# Patient Record
Sex: Female | Born: 1975 | Hispanic: No | Marital: Married | Smoking: Never smoker
Health system: Southern US, Community
[De-identification: ages and names within clinical notes are randomized; demographics above are authoritative.]

## PROBLEM LIST (undated history)

## (undated) ENCOUNTER — Inpatient Hospital Stay (HOSPITAL_COMMUNITY): Payer: Self-pay

## (undated) DIAGNOSIS — O24419 Gestational diabetes mellitus in pregnancy, unspecified control: Secondary | ICD-10-CM

## (undated) HISTORY — DX: Gestational diabetes mellitus in pregnancy, unspecified control: O24.419

## (undated) HISTORY — PX: NO PAST SURGERIES: SHX2092

---

## 2001-06-28 ENCOUNTER — Ambulatory Visit (HOSPITAL_COMMUNITY): Admission: RE | Admit: 2001-06-28 | Discharge: 2001-06-28 | Payer: Self-pay | Admitting: Gynecology

## 2001-06-28 ENCOUNTER — Encounter: Payer: Self-pay | Admitting: Gynecology

## 2002-04-12 ENCOUNTER — Encounter: Admission: RE | Admit: 2002-04-12 | Discharge: 2002-07-11 | Payer: Self-pay | Admitting: Gynecology

## 2002-08-10 ENCOUNTER — Inpatient Hospital Stay (HOSPITAL_COMMUNITY): Admission: AD | Admit: 2002-08-10 | Discharge: 2002-08-12 | Payer: Self-pay | Admitting: Gynecology

## 2013-08-15 LAB — OB RESULTS CONSOLE GC/CHLAMYDIA
Chlamydia: NEGATIVE
Gonorrhea: NEGATIVE

## 2013-08-15 LAB — OB RESULTS CONSOLE HIV ANTIBODY (ROUTINE TESTING): HIV: NONREACTIVE

## 2013-08-15 LAB — CULTURE, OB URINE: URINE CULTURE, OB: NEGATIVE

## 2013-08-15 LAB — OB RESULTS CONSOLE VARICELLA ZOSTER ANTIBODY, IGG: Varicella: IMMUNE

## 2013-08-15 LAB — SICKLE CELL SCREEN: Sickle Cell Screen: NORMAL

## 2013-08-15 LAB — OB RESULTS CONSOLE ANTIBODY SCREEN: Antibody Screen: NEGATIVE

## 2013-08-15 LAB — OB RESULTS CONSOLE HGB/HCT, BLOOD
HCT: 36 %
Hemoglobin: 11.8 g/dL

## 2013-08-15 LAB — OB RESULTS CONSOLE HEPATITIS B SURFACE ANTIGEN: Hepatitis B Surface Ag: NEGATIVE

## 2013-08-15 LAB — OB RESULTS CONSOLE ABO/RH: RH Type: POSITIVE

## 2013-08-15 LAB — GLUCOSE TOLERANCE, 1 HOUR: GLUCOSE: 127

## 2013-08-15 LAB — OB RESULTS CONSOLE RUBELLA ANTIBODY, IGM: Rubella: IMMUNE

## 2013-09-14 LAB — OB RESULTS CONSOLE RPR: RPR: NONREACTIVE

## 2013-09-21 LAB — OB RESULTS CONSOLE GC/CHLAMYDIA
Chlamydia: NEGATIVE
Gonorrhea: NEGATIVE

## 2013-12-26 ENCOUNTER — Ambulatory Visit (INDEPENDENT_AMBULATORY_CARE_PROVIDER_SITE_OTHER): Payer: Self-pay | Admitting: Obstetrics and Gynecology

## 2013-12-26 ENCOUNTER — Encounter: Payer: Self-pay | Attending: Obstetrics and Gynecology | Admitting: *Deleted

## 2013-12-26 ENCOUNTER — Encounter: Payer: Self-pay | Admitting: Obstetrics and Gynecology

## 2013-12-26 VITALS — BP 114/68 | HR 88 | Ht 63.39 in | Wt 168.4 lb

## 2013-12-26 DIAGNOSIS — Z713 Dietary counseling and surveillance: Secondary | ICD-10-CM | POA: Insufficient documentation

## 2013-12-26 DIAGNOSIS — O0992 Supervision of high risk pregnancy, unspecified, second trimester: Secondary | ICD-10-CM

## 2013-12-26 DIAGNOSIS — O24419 Gestational diabetes mellitus in pregnancy, unspecified control: Secondary | ICD-10-CM | POA: Insufficient documentation

## 2013-12-26 DIAGNOSIS — Z23 Encounter for immunization: Secondary | ICD-10-CM

## 2013-12-26 HISTORY — DX: Gestational diabetes mellitus in pregnancy, unspecified control: O24.419

## 2013-12-26 LAB — POCT URINALYSIS DIP (DEVICE)
BILIRUBIN URINE: NEGATIVE
GLUCOSE, UA: NEGATIVE mg/dL
Hgb urine dipstick: NEGATIVE
KETONES UR: NEGATIVE mg/dL
Nitrite: NEGATIVE
Protein, ur: NEGATIVE mg/dL
SPECIFIC GRAVITY, URINE: 1.01 (ref 1.005–1.030)
Urobilinogen, UA: 0.2 mg/dL (ref 0.0–1.0)
pH: 6.5 (ref 5.0–8.0)

## 2013-12-26 LAB — CBC
HEMATOCRIT: 34.5 % — AB (ref 36.0–46.0)
HEMOGLOBIN: 11.5 g/dL — AB (ref 12.0–15.0)
MCH: 30.1 pg (ref 26.0–34.0)
MCHC: 33.3 g/dL (ref 30.0–36.0)
MCV: 90.3 fL (ref 78.0–100.0)
Platelets: 159 10*3/uL (ref 150–400)
RBC: 3.82 MIL/uL — AB (ref 3.87–5.11)
RDW: 13.5 % (ref 11.5–15.5)
WBC: 7.3 10*3/uL (ref 4.0–10.5)

## 2013-12-26 NOTE — Progress Notes (Signed)
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 Pt reports eating 2 meals & 2-3 snacks/d. Pt is taking a PNV. Pt reports no N/V or heartburn. NKFA. Pt reports no physical activity. Pt received verbal & written education in Spanish via interpreter about GDM diet. Discussed wt gain goals of 15-25# or 0.6#/wk. Pt agrees to follow GDM diet with 3 meals & 2-3 snacks/d with proper CHO/ protein combination. Pt has WIC & plans to BF. F/u in 4-6 wks Blondell RevealLaura Ana Woodroof, MS, RD, LDN, [facility]

## 2013-12-26 NOTE — Progress Notes (Signed)
Anatomy U/S with Radiology 12/30/13 @ 1030a, Spanish interpreter Trinna Postlex present.

## 2013-12-26 NOTE — Progress Notes (Signed)
  Patient was seen on 12/26/13 for Gestational Diabetes self-management . The following learning objectives were met by the patient :   States the definition of Gestational Diabetes  States when to check blood glucose levels  Demonstrates proper blood glucose monitoring techniques  States the effect of stress and exercise on blood glucose levels  States the importance of limiting caffeine and abstaining from alcohol and smoking  Plan:  Consider  increasing your activity level by walking daily as tolerated Begin checking BG before breakfast and 1-2 hours after first bit of breakfast, lunch and dinner after  as directed by MD  Take medication  as directed by MD  Blood glucose monitor given: [employer] Lot # [id number] Exp: 2015/11/28 Blood glucose reading: 108 1hpp banana  Patient instructed to monitor glucose levels: FBS: 60 - <90 2 hour: <120  Patient received the following handouts:  Nutrition Diabetes and Pregnancy  Carbohydrate Counting List  Meal Planning worksheet  Patient will be seen for follow-up as needed.

## 2013-12-26 NOTE — Progress Notes (Signed)
Initial visit-- transfer from health department.  Interpreter Norvel RichardsAlex Autry used for this encounter.  Tdap and flu vaccine today.

## 2013-12-26 NOTE — Progress Notes (Signed)
Transfer from the health department for new diagnosis of GDM. Today feeling well and without complaints.  1. GDM. Unspecified control at this point. Will meet with diabetic educator, nutrition. Provide supplies and counseling on testing blood sugars and recording blood sugars.  2. Routine PNC. Obtain prenatal records. CBC, HIV, RPR today. Tdap, flu vaccines today.  RTC 1 week to review blood sugars.

## 2013-12-27 LAB — HIV ANTIBODY (ROUTINE TESTING W REFLEX): HIV: NONREACTIVE

## 2013-12-27 LAB — RPR

## 2013-12-29 ENCOUNTER — Ambulatory Visit (HOSPITAL_COMMUNITY): Payer: Self-pay

## 2013-12-30 ENCOUNTER — Other Ambulatory Visit: Payer: Self-pay | Admitting: Obstetrics and Gynecology

## 2013-12-30 ENCOUNTER — Ambulatory Visit (HOSPITAL_COMMUNITY)
Admission: RE | Admit: 2013-12-30 | Discharge: 2013-12-30 | Disposition: A | Discharge status: Home / Self Care | Payer: Self-pay | Source: Ambulatory Visit | Attending: Obstetrics and Gynecology | Admitting: Obstetrics and Gynecology

## 2013-12-30 DIAGNOSIS — O0992 Supervision of high risk pregnancy, unspecified, second trimester: Secondary | ICD-10-CM

## 2013-12-30 DIAGNOSIS — O403XX1 Polyhydramnios, third trimester, fetus 1: Secondary | ICD-10-CM

## 2013-12-30 DIAGNOSIS — O24410 Gestational diabetes mellitus in pregnancy, diet controlled: Secondary | ICD-10-CM | POA: Insufficient documentation

## 2013-12-30 DIAGNOSIS — O403XX0 Polyhydramnios, third trimester, not applicable or unspecified: Secondary | ICD-10-CM | POA: Insufficient documentation

## 2013-12-30 DIAGNOSIS — Z3A28 28 weeks gestation of pregnancy: Secondary | ICD-10-CM | POA: Insufficient documentation

## 2014-01-02 ENCOUNTER — Encounter: Payer: Self-pay | Admitting: Obstetrics and Gynecology

## 2014-01-02 ENCOUNTER — Encounter: Payer: Self-pay | Admitting: *Deleted

## 2014-01-03 ENCOUNTER — Encounter: Payer: Self-pay | Admitting: Obstetrics and Gynecology

## 2014-01-03 DIAGNOSIS — O409XX0 Polyhydramnios, unspecified trimester, not applicable or unspecified: Secondary | ICD-10-CM | POA: Insufficient documentation

## 2014-01-09 ENCOUNTER — Ambulatory Visit (INDEPENDENT_AMBULATORY_CARE_PROVIDER_SITE_OTHER): Payer: Self-pay | Admitting: Family Medicine

## 2014-01-09 ENCOUNTER — Encounter: Payer: Self-pay | Admitting: Obstetrics and Gynecology

## 2014-01-09 DIAGNOSIS — O24419 Gestational diabetes mellitus in pregnancy, unspecified control: Secondary | ICD-10-CM

## 2014-01-09 LAB — POCT URINALYSIS DIP (DEVICE)
BILIRUBIN URINE: NEGATIVE
Glucose, UA: NEGATIVE mg/dL
KETONES UR: NEGATIVE mg/dL
NITRITE: NEGATIVE
PROTEIN: NEGATIVE mg/dL
Specific Gravity, Urine: <=1.005 (ref 1.005–1.030)
Urobilinogen, UA: 0.2 mg/dL (ref 0.0–1.0)
pH: 5.5 (ref 5.0–8.0)

## 2014-01-09 MED ORDER — GLYBURIDE 2.5 MG PO TABS: 2.5000 mg | ORAL_TABLET | Freq: Two times a day (BID) | ORAL | Status: DC

## 2014-01-09 NOTE — Progress Notes (Signed)
Used Interpreter MariaElena Jimenez 

## 2014-01-09 NOTE — Patient Instructions (Signed)
Diabetes mellitus gestacional (Gestational Diabetes Mellitus) La diabetes mellitus gestacional, ms comnmente conocida como diabetes gestacional es un tipo de diabetes que desarrollan algunas mujeres durante el embarazo. En la diabetes gestacional, el pncreas no produce suficiente insulina (una hormona) o las clulas son menos sensibles a la insulina producida (resistencia a la insulina), o ambas cosas. Normalmente, la insulina mueve los azcares de los alimentos a las clulas de los tejidos. Las clulas de los tejidos utilizan los azcares para obtener energa. La falta de insulina o la falta de una respuesta normal a la insulina hace que el exceso de azcar se acumule en la sangre en lugar de penetrar en las clulas de los tejidos. Como resultado, se producen niveles altos de azcar en la sangre (hiperglucemia). El efecto de los niveles altos de azcar (glucosa) puede causar muchos problemas.  FACTORES DE RIESGO Usted tiene mayor probabilidad de desarrollar diabetes gestacional si tiene antecedentes familiares de diabetes y tambin si tiene uno o ms de los siguientes factores de riesgo:  ndice de masa corporal superior a 30 (obesidad).  Embarazo previo con diabetes gestacional.  La edad avanzada en el momento del embarazo. Si se mantienen los niveles de glucosa en la sangre en un rango normal durante el embarazo, las mujeres pueden tener un embarazo saludable. Si los niveles de glucosa en la sangre no estn bien controlados, puede haber riesgos para usted, el feto o el recin nacido, o durante el trabajo de parto y el parto.  SNTOMAS  Si se presentan sntomas, stos son similares a los sntomas que normalmente experimentar durante el embarazo. Los sntomas de la diabetes gestacional son:   Aumento de la sed (polidipsia).  Aumento de la miccin (poliuria).  Orina con ms frecuencia durante la noche (nocturia).  Prdida de peso. La prdida de peso puede ser muy rpida.  Infecciones  frecuentes y recurrentes.  Cansancio (fatiga).  Debilidad.  Cambios en la visin, como visin borrosa.  Olor a fruta en el aliento.  Dolor abdominal. DIAGNSTICO La diabetes se diagnostica cuando hay aumento de los niveles de glucosa en la sangre. El nivel de glucosa en la sangre puede controlarse en uno o ms de los siguientes anlisis de sangre:  Medicin de glucosa en la sangre en ayunas. No se le permitir comer durante al menos 8 horas antes de que se tome una muestra de sangre.  Pruebas al azar de glucosa en la sangre. El nivel de glucosa en la sangre se controla en cualquier momento del da sin importar el momento en que haya comido.  Prueba de A1c (hemoglobina glucosilada) Una prueba de A1c proporciona informacin sobre el control de la glucosa en la sangre durante los ltimos 3 meses.  Prueba de tolerancia a la glucosa oral (PTGO). La glucosa en la sangre se mide despus de no haber comido (ayunas) durante una a tres horas y despus de beber una bebida que contenga glucosa. Dado que las hormonas que causan la resistencia a la insulina son ms altas alrededor de las semanas 24 a 28 de embarazo, generalmente se realiza una PTGO durante ese tiempo. Si tiene factores de riesgo de diabetes gestacional, su mdico puede hacerle estudios de deteccin antes de las 24semanas de embarazo. TRATAMIENTO   Usted tendr que tomar medicamentos para la diabetes o insulina diariamente para mantener los niveles de glucosa en la sangre en el rango deseado.  Usted tendr que combinar la dosis de insulina con la actividad fsica y la eleccin de alimentos saludables. El objetivo del   tratamiento es mantener el nivel de azcar en la sangre previo a comer (preprandial) y durante la noche entre 60 y 99mg/dl, durante todo el embarazo. El objetivo del tratamiento es mantener el nivel pico de azcar en la sangre despus de comer (glucosa posprandial) entre 100y 140mg/dl. INSTRUCCIONES PARA EL CUIDADO EN EL  HOGAR   Controle su nivel de hemoglobina A1c dos veces al ao.  Contrlese a diario el nivel de glucosa en la sangre segn las indicaciones de su mdico. Es comn realizar controles frecuentes de la glucosa en la sangre.  Supervise las cetonas en la orina cuando est enferma y segn las indicaciones de su mdico.  Tome el medicamento para la diabetes y adminstrese insulina segn las indicaciones de su mdico para mantener el nivel de glucosa en la sangre en el rango deseado.  Nunca se quede sin medicamento para la diabetes o sin insulina. Es necesario que la reciba todos los das.  Ajuste la insulina segn la ingesta de hidratos de carbono. Los hidratos de carbono pueden aumentar los niveles de glucosa en la sangre, pero deben incluirse en su dieta. Los hidratos de carbono aportan vitaminas, minerales y fibra que son una parte esencial de una dieta saludable. Los hidratos de carbono se encuentran en frutas, verduras, cereales integrales, productos lcteos, legumbres y alimentos que contienen azcares aadidos.  Consuma alimentos saludables. Alterne 3 comidas con 3 colaciones.  Aumente de peso saludablemente. El aumento del peso total vara de acuerdo con el ndice de masa corporal que tena antes del embarazo (IMC).  Lleve una tarjeta de alerta mdica o use una pulsera o medalla de alerta mdica.  Lleve con usted una colacin de 15gramos de hidratos de carbono en todo momento para controlar los niveles bajos de glucosa en la sangre (hipoglucemia). Algunos ejemplos de colaciones de 15gramos de hidratos de carbono son los siguientes:  Tabletas de glucosa, 3 o 4.  Gel de glucosa, tubo de 15 gramos.  Pasas de uva, 2 cucharadas (24 g).  Caramelos de goma, 6.  Galletas de animales, 8.  Jugo de fruta, gaseosa comn, o leche descremada, 4 onzas (120 ml).  Pastillas de goma, 9.  Reconocer la hipoglucemia. Durante el embarazo la hipoglucemia se produce cuando hay niveles de glucosa en la  sangre de 60 mg/dl o menos. El riesgo de hipoglucemia aumenta durante el ayuno o cuando se saltea las comidas, durante o despus de realizar ejercicio intenso y mientras duerme. Los sntomas de hipoglucemia son:  Temblores o sacudidas.  Disminucin de la capacidad de concentracin.  Sudoracin.  Aumento de la frecuencia cardaca.  Dolor de cabeza.  Sequedad en la boca.  Hambre.  Irritabilidad.  Ansiedad.  Sueo agitado.  Alteracin del habla o de la coordinacin.  Confusin.  Tratar la hipoglucemia rpidamente. Si usted est alerta y puede tragar con seguridad, siga la regla de 15/15 que consiste en:  Tome entre 15 y 20gramos de glucosa de accin rpida o carbohidratos. Las opciones de accin rpida son un gel de glucosa, tabletas de glucosa, o 4 onzas (120 ml) de jugo de frutas, gaseosa comn, o leche baja en grasa.  Compruebe su nivel de glucosa en la sangre 15 minutos despus de tomar la glucosa.  Tome entre 15 y 20 gramos ms de glucosa si el nivel de glucosa en la sangre todava es de 70mg/dl o inferior.  Ingiera una comida o una colacin en el lapso de 1 hora una vez que los niveles de glucosa en la sangre vuelven   a la normalidad.  Est atento a la poliuria (miccin excesiva) y la polidipsia (sensacin de mucha sed), que son los primeros signos de la hiperglucemia. El reconocimiento temprano de la hiperglucemia permite un tratamiento oportuno. Trate la hiperglucemia segn le indic su mdico.  Haga actividad fsica por lo menos al da o como lo indique su mdico. Se recomienda que 30 minutos despus de cada comida, realice diez minutos de actividad fsica para controlar los niveles de glucosa postprandial en la [place].  Ajuste su dosis de insulina y la ingesta de alimentos, segn sea necesario, si inicia un nuevo ejercicio o deporte.  Siga su plan para los [address] de enfermedad cuando no pueda comer o beber como de [place].  Evite el tabaco y el  alcohol.  Concurra a todas las visitas de control como se lo haya indicado el mdico.  Siga el consejo del mdico respecto a los controles prenatales y posteriores al parto (postparto), las visitas, la planificacin de las comidas, el ejercicio, los medicamentos, las vitaminas, los anlisis de [place], otras pruebas mdicas y [place] fsicas.  Realice diariamente el cuidado de la piel y de los pies. Examine su piel y los pies diariamente para ver si tiene cortes, moretones, enrojecimiento, problemas en las uas, sangrado, ampollas o Advertising account planner.  Cepllese los dientes y encas por lo menos dos veces al da y use hilo dental al menos una vez por da. Concurra regularmente a las visitas de control con el dentista.  Programe un examen de vista durante el primer trimestre de su embarazo o como lo indique su mdico.  Comparta su plan de control de diabetes en el trabajo o en la escuela.  Mantngase al da con las vacunas.  Aprenda a Dealer.  Obtenga la mayor cantidad posible de informacin sobre la diabetes y solicite ayuda siempre que sea necesario.  Obtenga informacin sobre el amamantamiento y analice esta posibilidad.  Debe controlar el nivel de azcar en la sangre de 6a 12semanas despus del parto. Esto se hace con una prueba de tolerancia a la glucosa oral (PTGO). SOLICITE ATENCIN MDICA SI:   No puede comer alimentos o beber por ms de 6 horas.  Tuvo nuseas o ha vomitado durante ms de 6 horas.  Tiene un nivel de glucosa en la sangre de 200 mg/dl y cetonas en la orina.  Presenta algn cambio en el estado mental.  Desarrolla problemas de visin.  Sufre un dolor persistente de Training and development officer.  Siente dolor o molestias en la parte superior del abdomen.  Desarrolla una enfermedad grave adicional.  Tuvo diarrea durante ms de 6 horas.  Ha estado enfermo o ha tenido fiebre durante un par [address] y no mejora. SOLICITE ATENCIN MDICA DE INMEDIATO SI:   Tiene dificultad  para respirar.  Ya no siente los movimientos del beb.  Est sangrando o tiene flujo vaginal.  Comienza a tener contracciones o trabajo de [place] prematuro. ASEGRESE DE QUE:  Comprende estas instrucciones.  Controlar su afeccin.  Recibir ayuda de inmediato si no mejora o si empeora. Document Released: 12/04/2004 Document Revised: 07/11/2013 [facility] Patient Information 2015 [place], [place]. This information is not intended to replace advice given to you by your health care provider. Make sure you discuss any questions you have with your health care provider.  Tercer trimestre de Psychiatrist (Third Trimester of Pregnancy) El tercer trimestre va desde la semana29 hasta la 42, desde el sptimo hasta el noveno mes, y es la poca en la que el feto crece  ms rpidamente. Hacia el final del noveno mes, el feto mide alrededor de 20pulgadas (45cm) de largo y pesa entre 6 y 10 libras (2,700 y 584,500kg).  CAMBIOS EN EL ORGANISMO Su organismo atraviesa por muchos cambios durante el Minneolaembarazo, y estos varan de Neomia Dearuna mujer a Educational psychologistotra.   Seguir American Standard Companiesaumentando de peso. Es de esperar que aumente entre 25 y 35libras (11 y 16kg) hacia el final del Psychiatristembarazo.  Podrn aparecer las primeras Albertson'sestras en las caderas, el abdomen y las [place].  Puede tener necesidad de Geographical information systems officerorinar con ms frecuencia porque el feto baja hacia la pelvis y ejerce presin sobre la vejiga.  Debido al Vanetta Muldersembarazo podr sentir Anthoney Haradaacidez estomacal con frecuencia.  Puede estar estreida, ya que ciertas hormonas enlentecen los movimientos de los msculos que New York Life Insuranceempujan los desechos a travs de los intestinos.  Pueden aparecer hemorroides o abultarse e hincharse las venas (venas varicosas).  Puede sentir dolor plvico debido al Con-wayaumento de peso y a que las hormonas del Management consultantembarazo relajan las articulaciones entre los huesos de la pelvis. El dolor de espalda puede ser consecuencia de la sobrecarga de los msculos que soportan la [place]postura.  Tal vez haya  cambios en el cabello que pueden incluir su engrosamiento, crecimiento rpido y cambios en la textura. Adems, a algunas mujeres se les cae el cabello durante o despus del embarazo, o tienen el cabello seco o fino. Lo ms probable es que el cabello se le normalice despus del nacimiento del beb.  Las ConAgra Foodsmamas seguirn creciendo y Development worker, communityle dolern. A veces, puede haber una secrecin amarilla de las mamas llamada calostro.  El ombligo puede salir hacia afuera.  Puede sentir que le falta el aire debido a que se expande el tero.  Puede notar que el feto "baja" o lo siente ms bajo, en el abdomen.  Puede tener una prdida de secrecin mucosa con sangre. Esto suele ocurrir en el trmino de unos [address] das a una semana antes de que comience el Koutstrabajo de Galatiaparto.  El cuello del tero se vuelve delgado y blando (se borra) cerca de la fecha de [place]part[place]. QU DEBE ESPERAR EN LOS EXMENES PRENATALES  Le harn exmenes prenatales cada 2semanas hasta la semana36. A partir de ese momento le harn exmenes semanales. Durante una visita prenatal de rutina:  La pesarn para asegurarse de que usted y el feto estn creciendo normalmente.  Le tomarn la presin arterial.  Le medirn el abdomen para controlar el desarrollo del beb.  Se escucharn los latidos cardacos fetales.  Se evaluarn los resultados de los estudios solicitados en visitas anteriores.  Le revisarn el cuello del tero cuando est prxima la fecha de parto para controlar si este se ha borrado. Alrededor de la semana36, el mdico le revisar el cuello del tero. Al mismo tiempo, realizar un anlisis de las secreciones del tejido vaginal. Este examen es para determinar si hay un tipo de bacteria, estreptococo Grupo B. El mdico le explicar esto con ms detalle. El mdico puede preguntarle lo siguiente:  Cmo le gustara que fuera el [place] Northparto.  Cmo se siente.  Si siente los movimientos del beb.  Si ha tenido sntomas anormales, como prdida  de lquido, Grays Riversangrado, dolores de cabeza intensos o clicos abdominales.  Si tiene Colgate-Palmolivealguna pregunta. Otros exmenes o estudios de deteccin que pueden realizarse durante el tercer trimestre incluyen lo siguiente:  Anlisis de sangre para controlar las concentraciones de hierro (anemia).  Controles fetales para determinar su salud, nivel de [place] Vincent and the Grenadinesactividad y Designer, jewellerycrecimiento. Si tiene Jerseyalguna enfermedad  o hay problemas durante el embarazo, le harn estudios. FALSO TRABAJO DE PARTO Es posible que sienta contracciones leves e irregulares que finalmente desaparecen. Se llaman contracciones de Braxton Hicks o falso trabajo de parto. Las contracciones pueden durar horas, das o incluso semanas, antes de que el verdadero trabajo de parto se inicie. Si las contracciones ocurren a intervalos regulares, se intensifican o se hacen dolorosas, lo mejor es que la revise el mdico.  SIGNOS DE TRABAJO DE PARTO   Clicos de tipo menstrual.  Contracciones cada 5minutos o menos.  Contracciones que comienzan en la parte superior del tero y se extienden hacia abajo, a la zona inferior del abdomen y la espalda.  Sensacin de mayor presin en la pelvis o dolor de espalda.  Una secrecin de mucosidad acuosa o con sangre que sale de la vagina. Si tiene alguno de estos signos antes de la semana37 del embarazo, llame a su mdico de inmediato. Debe concurrir al hospital para que la controlen inmediatamente. INSTRUCCIONES PARA EL CUIDADO EN EL HOGAR   Evite fumar, consumir hierbas, beber alcohol y tomar frmacos que no le hayan recetado. Estas sustancias qumicas afectan la formacin y el desarrollo del beb.  Siga las indicaciones del mdico en relacin con el uso de medicamentos. Durante el embarazo, hay medicamentos que son seguros de tomar y otros que no.  Haga actividad fsica solo en la forma indicada por el mdico. Sentir clicos uterinos es un buen signo para detener la actividad fsica.  Contine comiendo alimentos  que sanos con regularidad.  Use un sostn que le brinde buen soporte si le duelen las mamas.  No se d baos de inmersin en agua caliente, baos turcos ni saunas.  Colquese el cinturn de seguridad cuando conduzca.  No coma carne cruda ni queso sin cocinar; evite el contacto con las bandejas sanitarias de los gatos y la tierra que estos animales usan. Estos elementos contienen grmenes que pueden causar defectos congnitos en el beb.  Tome las vitaminas prenatales.  Si est estreida, pruebe un laxante suave (si el mdico lo autoriza). Consuma ms alimentos ricos en fibra, como vegetales y frutas frescos y cereales integrales. Beba gran cantidad de lquido para mantener la orina de tono claro o color amarillo plido.  Dese baos de asiento con agua tibia para aliviar el dolor o las molestias causadas por las hemorroides. Use una crema para las hemorroides si el mdico la autoriza.  Si tiene venas varicosas, use medias de descanso. Eleve los pies durante 15minutos, 3 o 4veces por da. Limite la cantidad de sal en su dieta.  Evite levantar objetos pesados, use zapatos de tacones bajos y mantenga una buena postura.  Descanse con las piernas elevadas si tiene calambres o dolor de cintura.  Visite a su dentista si no lo ha hecho durante el embarazo. Use un cepillo de dientes blando para higienizarse los dientes y psese el hilo dental con suavidad.  Puede seguir manteniendo relaciones sexuales, a menos que el mdico le indique lo contrario.  No haga viajes largos excepto que sea absolutamente necesario y solo con la autorizacin del mdico.  Tome clases prenatales para entender, practicar y hacer preguntas sobre el trabajo de parto y el parto.  Haga un ensayo de la partida al hospital.  Prepare el bolso que llevar al hospital.  Prepare la habitacin del beb.  Concurra a todas las visitas prenatales segn las indicaciones de su mdico. SOLICITE ATENCIN MDICA SI:  No est  segura de que est en   trabajo de parto o de que ha roto la bolsa de las aguas.  Tiene mareos.  Siente clicos leves, presin en la pelvis o dolor persistente en el abdomen.  Tiene nuseas, vmitos o diarrea persistentes.  Tiene secrecin vaginal con mal olor.  Siente dolor al [employer]ocoPhillips. SOLICITE ATENCIN MDICA DE INMEDIATO SI:   Tiene fiebre.  Tiene una prdida de lquido por la vagina.  Tiene sangrado o pequeas prdidas vaginales.  Siente dolor intenso o clicos en el abdomen.  Sube o baja de peso rpidamente.  Tiene dificultad para respirar y siente dolor de pecho.  Sbitamente se le hinchan mucho el rostro, las [place], los tobillos, los pies o las piernas.  No ha sentido los movimientos del beb durante Georgianne Fick.  Siente un dolor de cabeza intenso que no se alivia con medicamentos.  Hay cambios en la visin. Document Released: 12/04/2004 Document Revised: 03/01/2013 [facility] Patient Information 2015 [place], [place]. This information is not intended to replace advice given to you by your health care provider. Make sure you discuss any questions you have with your health care provider.  Lactancia materna (Breastfeeding) Decidir Museum/gallery exhibitions officer es una de las mejores elecciones que puede hacer por usted y su beb. El cambio hormonal durante el Psychiatrist produce el desarrollo del tejido mamario y [place] la cantidad y el tamao de los conductos galactforos. Estas hormonas tambin permiten que las protenas, los azcares y las grasas de la sangre produzcan la [employer] materna en las glndulas productoras de [place]. Las hormonas impiden que la leche materna sea liberada antes del nacimiento del beb, adems de impulsar el flujo de leche luego del nacimiento. Una vez que ha comenzado a Museum/gallery exhibitions officer, Conservation officer, nature beb, as Immunologist succin o Theatre manager, pueden estimular la liberacin de [place] de las glndulas productoras de [place].  LOS BENEFICIOS DE AMAMANTAR Para el beb  La primera leche  (calostro) ayuda a Careers information officer funcionamiento del sistema digestivo del beb.  La leche tiene anticuerpos que ayudan a Radio producer las infecciones en el beb.  El beb tiene una menor incidencia de asma, alergias y del sndrome de muerte sbita del lactante.  Los nutrientes en la [place] materna son mejores para el beb que la [place] maternizada y estn preparados exclusivamente para cubrir las necesidades del beb.  La leche materna mejora el desarrollo cerebral del beb.  Es menos probable que el beb desarrolle otras enfermedades, como obesidad infantil, asma o diabetes mellitus de tipo 2. Para usted   La lactancia materna favorece el desarrollo de un vnculo muy especial entre la madre y el beb.  Es conveniente. La leche materna siempre est disponible a la Human resources officer y es [place].  La lactancia materna ayuda a quemar caloras y a perder el peso ganado durante el [place].  Favorece la contraccin del tero al tamao que tena antes del embarazo de manera ms rpida y disminuye el sangrado (loquios) despus del parto.  La lactancia materna contribuye a reducir Nurse, adult de desarrollar diabetes mellitus de tipo 2, osteoporosis o cncer de mama o de ovario en el futuro. SIGNOS DE QUE EL BEB EST HAMBRIENTO Primeros signos de [address] de [place].  Se estira.  Mueve la cabeza de un lado a otro.  Mueve la cabeza y abre la boca cuando se le toca la mejilla o la comisura de la boca (reflejo de bsqueda).  Aumenta las vocalizaciones, tales como sonidos de succin, se relame los labios, emite arrullos, suspiros, o  chirridos.  Mueve la [employer] boca.  Se chupa con ganas los dedos o las manos. Signos tardos de [employer].  Llora de manera intermitente. Signos de [employer] signos de hambre extrema requerirn que lo calme y lo consuele antes de que el beb pueda alimentarse adecuadamente. No espere a que se manifiesten los  siguientes signos de hambre extrema para comenzar a Museum/gallery exhibitions officer:   Designer, jewellery.  Llanto intenso y fuerte.   Gritos. INFORMACIN BSICA SOBRE LA LACTANCIA MATERNA Iniciacin de la lactancia materna  Encuentre un lugar cmodo para sentarse o acostarse, con un buen respaldo para el cuello y la espalda.  Coloque una almohada o una manta enrollada debajo del beb para acomodarlo a la altura de la mama (si est sentada). Las almohadas para Museum/gallery exhibitions officer se han diseado especialmente a fin de servir de apoyo para los brazos y el beb [employer].  Asegrese de que el abdomen del beb est frente al suyo.  Masajee suavemente la mama. Con las yemas de los dedos, masajee la pared del pecho hacia el pezn en un movimiento circular. Esto estimula el flujo de [place]. Es posible que Engineer, manufacturing systems este movimiento mientras amamanta si la leche fluye lentamente.  Sostenga la mama con el pulgar por arriba del pezn y los otros 4 dedos por debajo de la mama. Asegrese de que los dedos se encuentren lejos del pezn y de la boca del beb.  Empuje suavemente los labios del beb con el pezn o con el dedo.  Cuando la boca del beb se abra lo suficiente, acrquelo rpidamente a la mama e introduzca todo el pezn y la zona oscura que lo rodea (areola), tanto como sea posible, dentro de la boca del beb.  Debe haber ms areola visible por arriba del labio superior del beb que por debajo del labio inferior.  La lengua del beb debe estar entre la enca inferior y la [place].  Asegrese de que la boca del beb est en la posicin correcta alrededor del pezn (prendida). Los labios del beb deben crear un sello sobre la mama y estar doblados hacia afuera (invertidos).  Es comn que el beb succione durante 2 a 3 minutos para que comience el flujo de [place]. Cmo debe prenderse Es muy importante que le ensee al beb cmo prenderse adecuadamente a la mama. Si el beb no se prende adecuadamente, puede causarle  dolor en el pezn y reducir la produccin de [place], y hacer que el beb tenga un escaso aumento de [place]. Adems, si el beb no se prende adecuadamente al pezn, puede tragar aire durante la alimentacin. Esto puede causarle molestias al beb. Hacer eructar al beb al [place] de mama puede ayudarlo a liberar el aire. Sin embargo, ensearle al beb cmo prenderse a la mama adecuadamente es la mejor manera de evitar que se sienta molesto por tragar Oceanographer se alimenta. Signos de que el beb se ha prendido adecuadamente al pezn:   Payton Doughty o succiona de modo silencioso, sin causarle dolor.  Se escucha que traga cada 3 o 4 succiones.   Hay movimientos musculares por arriba y por delante de sus odos al Printmaker. Signos de que el beb no se ha prendido Audiological scientist al pezn:   Hace ruidos de succin o de chasquido mientras se alimenta.  Siente dolor en el pezn. Si cree que el beb no se prendi correctamente, deslice el dedo en la comisura de la boca y [employer] las encas del  beb para interrumpir la succin. Intente comenzar a amamantar nuevamente. Signos de Fish farm managerlactancia materna exitosa Signos del beb:   Disminuye gradualmente el nmero de succiones o cesa la succin por completo.  Se duerme.  Relaja el cuerpo.  Retiene una pequea cantidad de Kindred Healthcareleche en la boca.  Se desprende solo del pecho. Signos que presenta usted:  Las mamas han aumentado la firmeza, el peso y el tamao 1 a 3 horas despus de Museum/gallery exhibitions officeramamantar.  Estn ms blandas inmediatamente despus de amamantar.  Un aumento del volumen de [place], y tambin un cambio en su consistencia y color se producen hacia el quinto da de Tour managerlactancia materna.  Los pezones no duelen, ni estn agrietados ni sangran. Signos de que su beb recibe la cantidad de leche suficiente  Moja al menos 3 paales en 24 horas. La orina debe ser clara y de color amarillo plido a los 5 [address]  [address] de [place].  Defeca al menos 3 veces en 24 horas a los 5  [address]  [address] de [address]. La materia fecal debe ser blanda y Brodheadsvilleamarillenta.  Defeca al menos 3 veces en 24 horas a los [address] das de [address]. La materia fecal debe ser grumosa y Knottsvilleamarillenta.  No registra una prdida de peso mayor del 10% del peso al nacer durante los primeros 3 [address]  [address] de [place]ida.  Aumenta de peso un promedio de 4 a 7onzas (113 a 198g) por semana despus de los 4 [address]  [address] de vida.  Aumenta de Orangevillepeso, Forest Junctiondiariamente, de Fergusonmanera uniforme a Glass blower/designerpartir de los 5 [address]  [address] de vida, sin Passenger transport managerregistrar prdida de peso despus de las 2semanas de vida. Despus de alimentarse, es posible que el beb regurgite una pequea cantidad. Esto es frecuente. FRECUENCIA Y DURACIN DE LA LACTANCIA MATERNA El amamantamiento frecuente la ayudar a producir ms Azerbaijanleche y a Education officer, communityprevenir problemas de Engineer, miningdolor en los pezones e hinchazn en las Atlantamamas. Alimente al beb cuando muestre signos de hambre o si siente la necesidad de reducir la congestin de las Valdersmamas. Esto se denomina "lactancia a demanda". Evite el uso del chupete mientras trabaja para establecer la lactancia (las primeras 4 a 6 semanas despus del nacimiento del beb). Despus de este perodo, podr ofrecerle un chupete. Las investigaciones demostraron que el uso del chupete durante el primer ao de vida del beb disminuye el riesgo de desarrollar el sndrome de muerte sbita del lactante (SMSL). Permita que el nio se alimente en cada mama todo lo que desee. Contine amamantando al beb hasta que haya terminado de alimentarse. Cuando el beb se desprende o se queda dormido mientras se est alimentando de la primera mama, ofrzcale la segunda. Debido a que, con frecuencia, los recin Sunoconacidos permanecen somnolientos las primeras semanas de vida, es posible que deba despertar al beb para alimentarlo. Los horarios de Acupuncturistlactancia varan de un beb a otro. Sin embargo, las siguientes reglas pueden servir como gua para ayudarla a Lawyergarantizar que el beb se alimenta adecuadamente:  Se puede amamantar a los recin  nacidos (bebs de 4 semanas o menos de vida) cada 1 a 3 horas.  No deben transcurrir ms de 3 horas durante el da o 5 horas durante la noche sin que se amamante a los recin nacidos.  Debe amamantar al beb 8 veces como mnimo en un perodo de 24 horas, hasta que comience a introducir slidos en su dieta, a los 6 meses de vida aproximadamente. EXTRACCIN DE Dean Foods CompanyLECHE MATERNA La extraccin y Contractorel almacenamiento de la leche materna le permiten asegurarse de que el beb se alimente  exclusivamente de [employer], aun en momentos en los que no puede amamantar. Esto tiene especial importancia si debe regresar al Aleen Campi en el perodo en que an est amamantando o si no puede estar presente en los momentos en que el beb debe alimentarse. Su asesor en lactancia puede orientarla sobre cunto tiempo es seguro almacenar [place].  El sacaleche es un aparato que le permite extraer leche de la mama a un recipiente estril. Luego, la leche materna extrada puede almacenarse en un refrigerador o Electrical engineer. Algunos sacaleches son Birdie Riddle, Delaney Meigs otros son elctricos. Consulte a su asesor en lactancia qu tipo ser ms conveniente para usted. Los sacaleches se pueden comprar; sin embargo, algunos hospitales y grupos de apoyo a la lactancia materna alquilan Sports coach. Un asesor en lactancia puede ensearle cmo extraer [employer], en caso de que prefiera no usar un sacaleche.  CMO CUIDAR LAS MAMAS DURANTE LA LACTANCIA MATERNA Los pezones se secan, agrietan y duelen durante la Tour manager. Las siguientes recomendaciones pueden ayudarla a Pharmacologist las [employer] Partners y sanas:  Careers information officer usar jabn en los pezones.  Use un sostn de soporte. Aunque no son esenciales, las camisetas sin mangas o los sostenes especiales para Museum/gallery exhibitions officer estn diseados para acceder fcilmente a las mamas, para Museum/gallery exhibitions officer sin tener que quitarse todo el sostn o la camiseta. Evite usar sostenes con aro o  sostenes muy ajustados.  Seque al aire sus pezones durante 3 a despus de amamantar al beb.  Utilice solo apsitos de Haematologist sostn para Environmental health practitioner las prdidas de [place]. La prdida de un poco de [employer] Group tomas es normal.  Utilice lanolina sobre los pezones luego de Museum/gallery exhibitions officer. La lanolina ayuda a mantener la humedad normal de la piel. Si [place] lanolina pura, no tiene que lavarse los pezones antes de volver a Corporate treasurer al beb. La lanolina pura no es txica para el beb. Adems, puede extraer [employer] algunas gotas de [place] materna y Engineer, maintenance (IT) suavemente esa [employer], para que la [place] se seque al aire. Durante las primeras semanas despus de dar a luz, algunas mujeres pueden experimentar hinchazn en las mamas (congestin Vera). La congestin puede hacer que sienta las mamas pesadas, calientes y sensibles al tacto. El pico de la congestin ocurre dentro de los 3 a 5 das despus del Hernandez. Las siguientes recomendaciones pueden ayudarla a Paramedic la congestin:  Vace por completo las mamas al [employer] o Environmental health practitioner. Puede aplicar calor hmedo en las mamas (en la ducha o con toallas hmedas para manos) antes de Museum/gallery exhibitions officer o extraer [employer]. Esto aumenta la circulacin y [place] a que la [place]. Si el beb no vaca por completo las [address] cuando lo [address], extraiga la [place] restante despus de que haya finalizado.  Use un sostn ajustado (para amamantar o comn) o una camiseta sin mangas durante 1 o 2 das para indicar al cuerpo que disminuya ligeramente la produccin de [place].  Aplique compresas de hielo Yahoo[employer] Inc, a menos que le resulte demasiado incmodo.  Asegrese de que el beb est prendido y se encuentre en la posicin correcta mientras lo alimenta. Si la congestin persiste luego de 48 horas o despus de seguir estas recomendaciones, comunquese con su mdico o un Holiday representative. RECOMENDACIONES GENERALES PARA EL CUIDADO DE LA  SALUD DURANTE LA LACTANCIA MATERNA  Consuma alimentos saludables. Alterne comidas y colaciones, y coma 3 de cada una por da. Dado que lo que come [employer] Corporation  materna, es posible que algunas comidas hagan que su beb se vuelva ms irritable de lo habitual. Evite comer este tipo de alimentos si percibe que afectan de manera negativa al beb.  Beba leche, jugos de fruta y agua para Patent examiner su sed (aproximadamente 10 vasos al Futures trader).  Descanse con frecuencia, reljese y tome sus vitaminas prenatales para evitar la fatiga, el estrs y la anemia.  Contine con los autocontroles de la mama.  Evite masticar y fumar tabaco.  Evite el consumo de alcohol y drogas. Algunos medicamentos, que pueden ser perjudiciales para el beb, pueden pasar a travs de la [employer]. Es importante que consulte a su mdico antes de Medical sales representative, incluidos todos los medicamentos recetados y de [place], as como los suplementos vitamnicos y herbales. Puede quedar embarazada durante la lactancia. Si desea controlar la natalidad, consulte a su mdico cules son las opciones ms seguras para el beb. SOLICITE ATENCIN MDICA SI:   Usted siente que quiere dejar de Museum/gallery exhibitions officer o se siente frustrada con la lactancia.  Siente dolor en las mamas o en los pezones.  Sus pezones estn agrietados o Water quality scientist.  Sus pechos estn irritados, sensibles o calientes.  Tiene un rea hinchada en cualquiera de las mamas.  Siente escalofros o fiebre.  Tiene nuseas o vmitos.  Presenta una secrecin de otro lquido distinto de la leche materna de los pezones.  Sus mamas no se llenan antes de Museum/gallery exhibitions officer al beb para el quinto da despus del [place].  Se siente triste y deprimida.  El beb est demasiado somnoliento como para comer bien.  El beb tiene problemas para dormir.  Moja menos de 3 paales en 24 horas.  Defeca menos de 3 veces en 24 horas.  La piel del beb o la parte blanca de los ojos se  vuelven amarillentas.  El beb no ha aumentado de [place] a los [address] de [place]. SOLICITE ATENCIN MDICA DE INMEDIATO SI:   El beb est muy cansado Retail buyer) y no se quiere despertar para comer.  Le sube la fiebre sin causa. Document Released: 02/24/2005 Document Revised: 03/01/2013 [facility] Patient Information 2015 [place], [place]. This information is not intended to replace advice given to you by your health care provider. Make sure you discuss any questions you have with your health care provider.

## 2014-01-09 NOTE — Progress Notes (Signed)
FBS 106-124 all out of range 2 hour pp 85-155 (19 of 42 out of range) Begin Glyburide 2.5 mg bid Pt. Brings up low lying placenta--U/s officially does not read that way--called MFM--they have to internally review

## 2014-01-23 ENCOUNTER — Ambulatory Visit (INDEPENDENT_AMBULATORY_CARE_PROVIDER_SITE_OTHER): Payer: Self-pay | Admitting: Obstetrics & Gynecology

## 2014-01-23 VITALS — BP 106/67 | HR 77 | Temp 97.6°F | Wt 167.3 lb

## 2014-01-23 DIAGNOSIS — O0992 Supervision of high risk pregnancy, unspecified, second trimester: Secondary | ICD-10-CM

## 2014-01-23 DIAGNOSIS — O403XX1 Polyhydramnios, third trimester, fetus 1: Secondary | ICD-10-CM

## 2014-01-23 DIAGNOSIS — O24419 Gestational diabetes mellitus in pregnancy, unspecified control: Secondary | ICD-10-CM

## 2014-01-23 LAB — POCT URINALYSIS DIP (DEVICE)
Bilirubin Urine: NEGATIVE
Glucose, UA: NEGATIVE mg/dL
Hgb urine dipstick: NEGATIVE
KETONES UR: NEGATIVE mg/dL
Nitrite: NEGATIVE
PH: 5.5 (ref 5.0–8.0)
Protein, ur: NEGATIVE mg/dL
Urobilinogen, UA: 0.2 mg/dL (ref 0.0–1.0)

## 2014-01-23 MED ORDER — GLYBURIDE 2.5 MG PO TABS: ORAL_TABLET | ORAL | Status: DC

## 2014-01-23 NOTE — Patient Instructions (Signed)
Regrese a la clinica cuando tenga su cita. Si tiene problemas o preguntas, llama a la clinica o vaya a la sala de emergencia al [facility]   Eleccin del mtodo anticonceptivo (Contraception Choices) La anticoncepcin (control de la natalidad) es el uso de cualquier mtodo o dispositivo para Location managerevitar el embarazo. A continuacin se indican algunos de esos mtodos. MTODOS HORMONALES   El Implante contraconceptivo consiste en un tubo plstico delgado que contiene la hormona progesterona. No contiene estrgenos. El mdico inserta el tubo en la parte interna del brazo. El tubo puede Geneticist, molecularpermanecer en el lugar durante 3 aos. Despus de los 3 aos debe retirarse. El implante impide que los ovarios liberen vulos (ovulacin), espesa el moco cervical, lo que evita que los espermatozoides ingresen al tero y hace ms delgada la membrana que cubre el interior del tero.  Inyecciones de progesterona sola: las Insurance underwriteradministra el mdico cada 3 meses para Location managerevitar el embarazo. La progesterona sinttica impide que los ovarios liberen vulos. Tambin hacen que el moco cervical se espese y modifique el tejido de recubrimiento interno del tero. Esto hace ms difcil que los espermatozoides sobrevivan en el tero.  Las pldoras anticonceptivas contienen estrgenos y Education officer, museumprogesterona. Su funcin es ALLTEL Corporationevitar que los ovarios liberen vulos (ovulacin). Las hormonas de los anticonceptivos orales hacen que el moco cervical se haga ms espeso, lo que evita que el esperma ingrese al tero. Las pldoras anticonceptivas son recetadas por el mdico.Tambin se utilizan para tratar los perodos menstruales abundantes.  Minipldora: este tipo de pldora anticonceptiva contiene slo hormona progesterona. Deben tomarse todos los [address]  [address] del mes y debe recetarlas el mdico.  El parche de control de natalidad: contiene hormonas similares a las que contienen las pldoras anticonceptivas. Deben cambiarse una vez por semana y se utilizan bajo  prescripcin mdica.  Anillo vaginal: contiene hormonas similares a las que contienen las pldoras anticonceptivas. Se deja colocado durante tres semanas, se lo retira durante 1 semana y luego se coloca uno nuevo. La paciente debe sentirse cmoda al insertar y retirar el anillo de la vagina.Es necesaria la prescripcin mdica.  Anticonceptivos de emergencia: son mtodos para evitar un embarazo despus de Neomia Dearuna relacin sexual sin proteccin. Esta pldora puede tomarse inmediatamente despus de Child psychotherapisttener relaciones sexuales o hasta 5 Marquettedas de haber tenido sexo sin proteccin. Es ms efectiva si se toma poco tiempo despus de la relacin sexual. Los anticonceptivos de emergencia estn disponibles sin prescripcin mdica. Consltelo con su farmacutico. No use los anticonceptivos de emergencia como nico mtodo anticonceptivo. MTODOS DE Lenis NoonBARRERA   Condn masculino: es una vaina delgada (ltex o goma) que se coloca cubriendo al pene durante el acto sexual. Deri Fuellinguede usarse con espermicida para aumentar la efectividad.  Condn femenino. Es una funda delicada y blanda que se adapta holgadamente a la vagina antes de las Clinical research associaterelaciones sexuales.  Diafragma: es una barrera de ltex redonda y suave que debe ser recomendado por un profesional. Se inserta en la vagina, junto con un gel espermicida. Debe insertarse antes de Management consultanttener relaciones sexuales. Debe dejar el diafragma colocado en la vagina durante 6 a 8 horas despus de la relacin sexual.  Capuchn cervical: es una barrera de ltex o taza plstica redonda y Bahamassuave que cubre el cuello del tero y debe ser colocada por un mdico. Puede dejarlo colocado en la vagina hasta 48 horas despus de las Clinical research associaterelaciones sexuales.  Esponja: es una pieza blanda y circular de espuma de poliuretano. Contiene un espermicida. Se inserta en la vagina despus de mojarla  y antes de las The St. Paul Travelersrelaciones sexuales.  Espermicidas: son sustancias qumicas que matan o bloquean al esperma y no lo dejan  ingresar al cuello del tero y al tero. Vienen en forma de cremas, geles, supositorios, espuma o comprimidos. No es necesario tener Emergency planning/management officerreceta mdica. Se insertan en la vagina con un aplicador antes de Management consultanttener relaciones sexuales. El proceso debe repetirse cada vez que tiene relaciones sexuales. ANTICONCEPTIVOS INTRAUTERINOS  Dispositivo intrauterino (DIU) es un dispositivo en forma de T que se coloca en el tero durante el perodo menstrual, para Location managerevitar el embarazo. Hay dos tipos:  DIU de cobre: este tipo de DIU est recubierto con un alambre de cobre y se inserta dentro del tero. El cobre hace que el tero y las trompas de Falopio produzcan un liquido que Federated Department Storesdestruye los espermatozoides. Puede permanecer colocado durante 10 aos.  DIU con hormona: este tipo de DIU contiene la hormona progestina (progesterona sinttica). La hormona espesa el moco cervical y evita que los espermatozoides ingresen al tero y tambin afina la membrana que cubre el tero para evitar la implantacin del vulo fertilizado. La hormona debilita o destruye los espermatozoides que ingresan al tero. Puede Geneticist, molecularpermanecer en el lugar durante 3-5 aos, segn el tipo de DIU que se Vaughnsvilleutilice. MTODOS ANTICONCEPTIVOS PERMANENTES  Ligadura de trompas en la mujer: se realiza sellando, atando u obstruyendo quirrgicamente las trompas de Falopio lo que impide que el vulo descienda hacia el tero.  Esterilizacin histeroscpica: Implica la colocacin de un pequeo espiral o la insercin en cada trompa de Falopio. El mdico utiliza una tcnica llamada histeroscopa para Primary school teacherrealizar este procedimiento. El dispositivo produce la formacin de tejido Designer, television/film setcicatrizal. Esto da como resultado una obstruccin permanente de las trompas de Falopio, de modo que la esperma no pueda fertilizar el vulo. Demora alrededor de 3 meses despus del procedimiento hasta que el conducto se obstruye. Tendr que usar otro mtodo anticonceptivo durante al menos 3  meses.  Esterilizacin masculina: se realiza ligando los conductos por los que pasan los espermatozoides (vasectoma).Esto impide que el esperma ingrese a la vagina durante el acto sexual. Luego del procedimiento, el hombre puede eyacular lquido (semen). MTODOS DE PLANIFICACIN NATURAL  Planificacin familiar natural: consiste en no Management consultanttener relaciones sexuales o usar un mtodo de barrera (condn, Cubadiafragma, capuchn cervical) en los IKON Office Solutionsdas que la mujer podra quedar Centrevilleembarazada.  Mtodo de calendario: consiste en el seguimiento de la duracin de cada ciclo menstrual y la identificacin de los perodos frtiles.  Mtodo de ovulacin: Paramedicconsiste en evitar las relaciones sexuales durante la ovulacin.  Mtodo sintotrmico: Advertising copywriterconsiste en evitar las relaciones sexuales en la poca en la que se est ovulando, utilizando un termmetro y tendiendo en cuenta los sntomas de la ovulacin.  Mtodo postovulacin: Youth workerconsiste en planificar las relaciones sexuales para despus de haber ovulado. Independientemente del tipo o mtodo anticonceptivo que usted elija, es importante que use condones para protegerse contra las infecciones de transmisin sexual (ETS). Hable con su mdico con respecto a qu mtodo anticonceptivo es el ms apropiado para usted. Document Released: 02/24/2005 Document Revised: 10/27/2012 [facility] Patient Information 2015 [place], [place]. This information is not intended to replace advice given to you by your health care provider. Make sure you discuss any questions you have with your health care provider.

## 2014-01-23 NOTE — Progress Notes (Signed)
blanca present for interpreter

## 2014-01-23 NOTE — Addendum Note (Signed)
Addended by: Jaynie CollinsANYANWU, Reita Shindler A on: 01/23/2014 10:21 AM   Modules accepted: Level of Service

## 2014-01-23 NOTE — Progress Notes (Signed)
Patient is Spanish-speaking only, Spanish interpreter present for this encounter. On review of blood sugars, fasting elevated 87-105 (eight values above 100).  Normal postprandials except one elevated 140 after dinner. Will increase Glyburide to 2.5 mg in the am; 3.75 mg in pm. Reevaluate in one week. Follow up ultrasound ordered given polyhydramnios in 28 weeks. Antenatal testing to start next week. BPP ordered for next week. No other complaints or concerns.  Labor and fetal movement precautions reviewed.

## 2014-01-23 NOTE — Progress Notes (Signed)
Ultrasound with Radiology 01/30/14 @ 8a.  Spanish interpreter Laveda NormanBlanca Linder present.

## 2014-01-30 ENCOUNTER — Ambulatory Visit (INDEPENDENT_AMBULATORY_CARE_PROVIDER_SITE_OTHER): Payer: Self-pay | Admitting: Obstetrics & Gynecology

## 2014-01-30 ENCOUNTER — Ambulatory Visit (HOSPITAL_COMMUNITY)
Admission: RE | Admit: 2014-01-30 | Discharge: 2014-01-30 | Disposition: A | Discharge status: Home / Self Care | Payer: Self-pay | Source: Ambulatory Visit | Attending: Obstetrics & Gynecology | Admitting: Obstetrics & Gynecology

## 2014-01-30 VITALS — BP 105/66 | HR 67 | Temp 97.7°F | Wt 165.2 lb

## 2014-01-30 DIAGNOSIS — O24410 Gestational diabetes mellitus in pregnancy, diet controlled: Secondary | ICD-10-CM | POA: Insufficient documentation

## 2014-01-30 DIAGNOSIS — O403XX0 Polyhydramnios, third trimester, not applicable or unspecified: Secondary | ICD-10-CM

## 2014-01-30 DIAGNOSIS — O24419 Gestational diabetes mellitus in pregnancy, unspecified control: Secondary | ICD-10-CM

## 2014-01-30 DIAGNOSIS — O403XX1 Polyhydramnios, third trimester, fetus 1: Secondary | ICD-10-CM

## 2014-01-30 DIAGNOSIS — Z3A32 32 weeks gestation of pregnancy: Secondary | ICD-10-CM | POA: Insufficient documentation

## 2014-01-30 DIAGNOSIS — O09523 Supervision of elderly multigravida, third trimester: Secondary | ICD-10-CM | POA: Insufficient documentation

## 2014-01-30 LAB — POCT URINALYSIS DIP (DEVICE)
Bilirubin Urine: NEGATIVE
GLUCOSE, UA: NEGATIVE mg/dL
HGB URINE DIPSTICK: NEGATIVE
Ketones, ur: NEGATIVE mg/dL
NITRITE: NEGATIVE
PROTEIN: NEGATIVE mg/dL
Urobilinogen, UA: 0.2 mg/dL (ref 0.0–1.0)
pH: 5.5 (ref 5.0–8.0)

## 2014-01-30 NOTE — Patient Instructions (Signed)
Regrese a la clinica cuando tenga su cita. Si tiene problemas o preguntas, llama a la clinica o vaya a la sala [facility] emergencia al [facility].    

## 2014-01-30 NOTE — Progress Notes (Signed)
Patient reports occasional pelvic pressure; blanca present for interpreter.  US done today

## 2014-01-30 NOTE — Progress Notes (Signed)
Patient is Spanish-speaking only, Spanish interpreter present for this encounter. Blood sugars reviewed, all within range except one fasting in 100, continue current regimen. NST performed today was reviewed and was found to be reactive.  Continue recommended antenatal testing and prenatal care. No other complaints or concerns.  Labor and fetal movement precautions reviewed.

## 2014-02-06 ENCOUNTER — Ambulatory Visit (INDEPENDENT_AMBULATORY_CARE_PROVIDER_SITE_OTHER): Payer: Self-pay | Admitting: Family Medicine

## 2014-02-06 VITALS — BP 101/60 | HR 78 | Temp 98.1°F | Wt 167.1 lb

## 2014-02-06 DIAGNOSIS — O403XX0 Polyhydramnios, third trimester, not applicable or unspecified: Secondary | ICD-10-CM

## 2014-02-06 DIAGNOSIS — O403XX1 Polyhydramnios, third trimester, fetus 1: Secondary | ICD-10-CM

## 2014-02-06 DIAGNOSIS — O24419 Gestational diabetes mellitus in pregnancy, unspecified control: Secondary | ICD-10-CM

## 2014-02-06 LAB — POCT URINALYSIS DIP (DEVICE)
Bilirubin Urine: NEGATIVE
Glucose, UA: NEGATIVE mg/dL
Hgb urine dipstick: NEGATIVE
KETONES UR: NEGATIVE mg/dL
Nitrite: NEGATIVE
PH: 5.5 (ref 5.0–8.0)
PROTEIN: NEGATIVE mg/dL
SPECIFIC GRAVITY, URINE: 1.01 (ref 1.005–1.030)
UROBILINOGEN UA: 0.2 mg/dL (ref 0.0–1.0)

## 2014-02-06 LAB — US OB FOLLOW UP

## 2014-02-06 NOTE — Progress Notes (Signed)
Patient reports little pain in RLQ

## 2014-02-06 NOTE — Patient Instructions (Signed)
Diabetes mellitus gestacional (Gestational Diabetes Mellitus) La diabetes mellitus gestacional, ms comnmente conocida como diabetes gestacional es un tipo de diabetes que desarrollan algunas mujeres durante el embarazo. En la diabetes gestacional, el pncreas no produce suficiente insulina (una hormona) o las clulas son menos sensibles a la insulina producida (resistencia a la insulina), o ambas cosas. Normalmente, la insulina mueve los azcares de los alimentos a las clulas de los tejidos. Las clulas de los tejidos utilizan los azcares para obtener energa. La falta de insulina o la falta de una respuesta normal a la insulina hace que el exceso de azcar se acumule en la sangre en lugar de penetrar en las clulas de los tejidos. Como resultado, se producen niveles altos de azcar en la sangre (hiperglucemia). El efecto de los niveles altos de azcar (glucosa) puede causar muchos problemas.  FACTORES DE RIESGO Usted tiene mayor probabilidad de desarrollar diabetes gestacional si tiene antecedentes familiares de diabetes y tambin si tiene uno o ms de los siguientes factores de riesgo:  ndice de masa corporal superior a 30 (obesidad).  Embarazo previo con diabetes gestacional.  La edad avanzada en el momento del embarazo. Si se mantienen los niveles de glucosa en la sangre en un rango normal durante el embarazo, las mujeres pueden tener un embarazo saludable. Si los niveles de glucosa en la sangre no estn bien controlados, puede haber riesgos para usted, el feto o el recin nacido, o durante el trabajo de parto y el parto.  SNTOMAS  Si se presentan sntomas, stos son similares a los sntomas que normalmente experimentar durante el embarazo. Los sntomas de la diabetes gestacional son:   Aumento de la sed (polidipsia).  Aumento de la miccin (poliuria).  Orina con ms frecuencia durante la noche (nocturia).  Prdida de peso. La prdida de peso puede ser muy rpida.  Infecciones  frecuentes y recurrentes.  Cansancio (fatiga).  Debilidad.  Cambios en la visin, como visin borrosa.  Olor a fruta en el aliento.  Dolor abdominal. DIAGNSTICO La diabetes se diagnostica cuando hay aumento de los niveles de glucosa en la sangre. El nivel de glucosa en la sangre puede controlarse en uno o ms de los siguientes anlisis de sangre:  Medicin de glucosa en la sangre en ayunas. No se le permitir comer durante al menos 8 horas antes de que se tome una muestra de sangre.  Pruebas al azar de glucosa en la sangre. El nivel de glucosa en la sangre se controla en cualquier momento del da sin importar el momento en que haya comido.  Prueba de A1c (hemoglobina glucosilada) Una prueba de A1c proporciona informacin sobre el control de la glucosa en la sangre durante los ltimos 3 meses.  Prueba de tolerancia a la glucosa oral (PTGO). La glucosa en la sangre se mide despus de no haber comido (ayunas) durante una a tres horas y despus de beber una bebida que contenga glucosa. Dado que las hormonas que causan la resistencia a la insulina son ms altas alrededor de las semanas 24 a 28 de embarazo, generalmente se realiza una PTGO durante ese tiempo. Si tiene factores de riesgo de diabetes gestacional, su mdico puede hacerle estudios de deteccin antes de las 24semanas de embarazo. TRATAMIENTO   Usted tendr que tomar medicamentos para la diabetes o insulina diariamente para mantener los niveles de glucosa en la sangre en el rango deseado.  Usted tendr que combinar la dosis de insulina con la actividad fsica y la eleccin de alimentos saludables. El objetivo del   tratamiento es mantener el nivel de azcar en la sangre previo a comer (preprandial) y durante la noche entre 60 y 99mg/dl, durante todo el embarazo. El objetivo del tratamiento es mantener el nivel pico de azcar en la sangre despus de comer (glucosa posprandial) entre 100y 140mg/dl. INSTRUCCIONES PARA EL CUIDADO EN EL  HOGAR   Controle su nivel de hemoglobina A1c dos veces al ao.  Contrlese a diario el nivel de glucosa en la sangre segn las indicaciones de su mdico. Es comn realizar controles frecuentes de la glucosa en la sangre.  Supervise las cetonas en la orina cuando est enferma y segn las indicaciones de su mdico.  Tome el medicamento para la diabetes y adminstrese insulina segn las indicaciones de su mdico para mantener el nivel de glucosa en la sangre en el rango deseado.  Nunca se quede sin medicamento para la diabetes o sin insulina. Es necesario que la reciba todos los das.  Ajuste la insulina segn la ingesta de hidratos de carbono. Los hidratos de carbono pueden aumentar los niveles de glucosa en la sangre, pero deben incluirse en su dieta. Los hidratos de carbono aportan vitaminas, minerales y fibra que son una parte esencial de una dieta saludable. Los hidratos de carbono se encuentran en frutas, verduras, cereales integrales, productos lcteos, legumbres y alimentos que contienen azcares aadidos.  Consuma alimentos saludables. Alterne 3 comidas con 3 colaciones.  Aumente de peso saludablemente. El aumento del peso total vara de acuerdo con el ndice de masa corporal que tena antes del embarazo ([facility]).  Lleve una tarjeta de alerta mdica o use una pulsera o medalla de alerta mdica.  Lleve con usted una colacin de 15gramos de hidratos de carbono en todo momento para controlar los niveles bajos de glucosa en la sangre (hipoglucemia). Algunos ejemplos de colaciones de 15gramos de hidratos de carbono son los siguientes:  Tabletas de glucosa, 3 o 4.  Gel de glucosa, tubo de 15 gramos.  Pasas de uva, 2 cucharadas (24 g).  Caramelos de goma, 6.  Galletas de animales, 8.  Jugo de fruta, gaseosa comn, o leche descremada, 4 onzas (120 ml).  Pastillas de goma, 9.  Reconocer la hipoglucemia. Durante el embarazo la hipoglucemia se produce cuando hay niveles de glucosa en la  sangre de 60 mg/dl o menos. El riesgo de hipoglucemia aumenta durante el ayuno o cuando se saltea las comidas, durante o despus de realizar ejercicio intenso y mientras duerme. Los sntomas de hipoglucemia son:  Temblores o sacudidas.  Disminucin de la capacidad de concentracin.  Sudoracin.  Aumento de la frecuencia cardaca.  Dolor de cabeza.  Sequedad en la boca.  Hambre.  Irritabilidad.  Ansiedad.  Sueo agitado.  Alteracin del habla o de la coordinacin.  Confusin.  Tratar la hipoglucemia rpidamente. Si usted est alerta y puede tragar con seguridad, siga la regla de 15/15 que consiste en:  Tome entre 15 y 20gramos de glucosa de accin rpida o carbohidratos. Las opciones de accin rpida son un gel de glucosa, tabletas de glucosa, o 4 onzas (120 ml) de jugo de frutas, gaseosa comn, o leche baja en grasa.  Compruebe su nivel de glucosa en la sangre 15 minutos despus de tomar la glucosa.  Tome entre 15 y 20 gramos ms de glucosa si el nivel de glucosa en la sangre todava es de 70mg/dl o inferior.  Ingiera una comida o una colacin en el lapso de 1 hora una vez que los niveles de glucosa en la sangre vuelven   a la normalidad.  Est atento a la poliuria (miccin excesiva) y la polidipsia (sensacin de mucha sed), que son los primeros signos de la hiperglucemia. El reconocimiento temprano de la hiperglucemia permite un tratamiento oportuno. Trate la hiperglucemia segn le indic su mdico.  Haga actividad fsica por lo menos 30minutos al da o como lo indique su mdico. Se recomienda que 30 minutos despus de cada comida, realice diez minutos de actividad fsica para controlar los niveles de glucosa postprandial en la sangre.  Ajuste su dosis de insulina y la ingesta de alimentos, segn sea necesario, si inicia un nuevo ejercicio o deporte.  Siga su plan para los das de enfermedad cuando no pueda comer o beber como de costumbre.  Evite el tabaco y el  alcohol.  Concurra a todas las visitas de control como se lo haya indicado el mdico.  Siga el consejo del mdico respecto a los controles prenatales y posteriores al parto (postparto), las visitas, la planificacin de las comidas, el ejercicio, los medicamentos, las vitaminas, los anlisis de sangre, otras pruebas mdicas y actividades fsicas.  Realice diariamente el cuidado de la piel y de los pies. Examine su piel y los pies diariamente para ver si tiene cortes, moretones, enrojecimiento, problemas en las uas, sangrado, ampollas o llagas.  Cepllese los dientes y encas por lo menos dos veces al da y use hilo dental al menos una vez por da. Concurra regularmente a las visitas de control con el dentista.  Programe un examen de vista durante el primer trimestre de su embarazo o como lo indique su mdico.  Comparta su plan de control de diabetes en el trabajo o en la escuela.  Mantngase al da con las vacunas.  Aprenda a manejar el estrs.  Obtenga la mayor cantidad posible de informacin sobre la diabetes y solicite ayuda siempre que sea necesario.  Obtenga informacin sobre el amamantamiento y analice esta posibilidad.  Debe controlar el nivel de azcar en la sangre de 6a 12semanas despus del parto. Esto se hace con una prueba de tolerancia a la glucosa oral (PTGO). SOLICITE ATENCIN MDICA SI:   No puede comer alimentos o beber por ms de 6 horas.  Tuvo nuseas o ha vomitado durante ms de 6 horas.  Tiene un nivel de glucosa en la sangre de 200 mg/dl y cetonas en la orina.  Presenta algn cambio en el estado mental.  Desarrolla problemas de visin.  Sufre un dolor persistente de cabeza.  Siente dolor o molestias en la parte superior del abdomen.  Desarrolla una enfermedad grave adicional.  Tuvo diarrea durante ms de 6 horas.  Ha estado enfermo o ha tenido fiebre durante un par de das y no mejora. SOLICITE ATENCIN MDICA DE INMEDIATO SI:   Tiene dificultad  para respirar.  Ya no siente los movimientos del beb.  Est sangrando o tiene flujo vaginal.  Comienza a tener contracciones o trabajo de parto prematuro. ASEGRESE DE QUE:  Comprende estas instrucciones.  Controlar su afeccin.  Recibir ayuda de inmediato si no mejora o si empeora. Document Released: 12/04/2004 Document Revised: 07/11/2013 [employer] Patient Information 2015 [employer]. This information is not intended to replace advice given to you by your health care provider. Make sure you discuss any questions you have with your health care provider.  Lactancia materna (Breastfeeding) Decidir amamantar es una de las mejores elecciones que puede hacer por usted y su beb. El cambio hormonal durante el embarazo produce el desarrollo del tejido mamario y aumenta la cantidad   y el tamao de los conductos galactforos. Estas hormonas tambin permiten que las protenas, los azcares y las grasas de la sangre produzcan la leche materna en las glndulas productoras de leche. Las hormonas impiden que la leche materna sea liberada antes del nacimiento del beb, adems de impulsar el flujo de leche luego del nacimiento. Una vez que ha comenzado a amamantar, pensar en el beb, as como la succin o el llanto, pueden estimular la liberacin de leche de las glndulas productoras de leche.  LOS BENEFICIOS DE AMAMANTAR Para el beb  La primera leche (calostro) ayuda a mejorar el funcionamiento del sistema digestivo del beb.  La leche tiene anticuerpos que ayudan a prevenir las infecciones en el beb.  El beb tiene una menor incidencia de asma, alergias y del sndrome de muerte sbita del lactante.  Los nutrientes en la leche materna son mejores para el beb que la leche maternizada y estn preparados exclusivamente para cubrir las necesidades del beb.  La leche materna mejora el desarrollo cerebral del beb.  Es menos probable que el beb desarrolle otras enfermedades, como obesidad  infantil, asma o diabetes mellitus de tipo 2. Para usted   La lactancia materna favorece el desarrollo de un vnculo muy especial entre la madre y el beb.  Es conveniente. La leche materna siempre est disponible a la temperatura correcta y es econmica.  La lactancia materna ayuda a quemar caloras y a perder el peso ganado durante el embarazo.  Favorece la contraccin del tero al tamao que tena antes del embarazo de manera ms rpida y disminuye el sangrado (loquios) despus del parto.  La lactancia materna contribuye a reducir el riesgo de desarrollar diabetes mellitus de tipo 2, osteoporosis o cncer de mama o de ovario en el futuro. SIGNOS DE QUE EL BEB EST HAMBRIENTO Primeros signos de hambre  Aumenta su estado de alerta o actividad.  Se estira.  Mueve la cabeza de un lado a otro.  Mueve la cabeza y abre la boca cuando se le toca la mejilla o la comisura de la boca (reflejo de bsqueda).  Aumenta las vocalizaciones, tales como sonidos de succin, se relame los labios, emite arrullos, suspiros, o chirridos.  Mueve la mano hacia la boca.  Se chupa con ganas los dedos o las manos. Signos tardos de hambre  Est agitado.  Llora de manera intermitente. Signos de hambre extrema Los signos de hambre extrema requerirn que lo calme y lo consuele antes de que el beb pueda alimentarse adecuadamente. No espere a que se manifiesten los siguientes signos de hambre extrema para comenzar a amamantar:   Agitacin.  Llanto intenso y fuerte.   Gritos. INFORMACIN BSICA SOBRE LA LACTANCIA MATERNA Iniciacin de la lactancia materna  Encuentre un lugar cmodo para sentarse o acostarse, con un buen respaldo para el cuello y la espalda.  Coloque una almohada o una manta enrollada debajo del beb para acomodarlo a la altura de la mama (si est sentada). Las almohadas para amamantar se han diseado especialmente a fin de servir de apoyo para los brazos y el beb mientras  amamanta.  Asegrese de que el abdomen del beb est frente al suyo.  Masajee suavemente la mama. Con las yemas de los dedos, masajee la pared del pecho hacia el pezn en un movimiento circular. Esto estimula el flujo de leche. Es posible que deba continuar este movimiento mientras amamanta si la leche fluye lentamente.  Sostenga la mama con el pulgar por arriba del pezn y los otros   4 dedos por debajo de la mama. Asegrese de que los dedos se encuentren lejos del pezn y de la boca del beb.  Empuje suavemente los labios del beb con el pezn o con el dedo.  Cuando la boca del beb se abra lo suficiente, acrquelo rpidamente a la mama e introduzca todo el pezn y la zona oscura que lo rodea (areola), tanto como sea posible, dentro de la boca del beb.  Debe haber ms areola visible por arriba del labio superior del beb que por debajo del labio inferior.  La lengua del beb debe estar entre la enca inferior y la mama.  Asegrese de que la boca del beb est en la posicin correcta alrededor del pezn (prendida). Los labios del beb deben crear un sello sobre la mama y estar doblados hacia afuera (invertidos).  Es comn que el beb succione durante 2 a 3 minutos para que comience el flujo de leche materna. Cmo debe prenderse Es muy importante que le ensee al beb cmo prenderse adecuadamente a la mama. Si el beb no se prende adecuadamente, puede causarle dolor en el pezn y reducir la produccin de leche materna, y hacer que el beb tenga un escaso aumento de peso. Adems, si el beb no se prende adecuadamente al pezn, puede tragar aire durante la alimentacin. Esto puede causarle molestias al beb. Hacer eructar al beb al cambiar de mama puede ayudarlo a liberar el aire. Sin embargo, ensearle al beb cmo prenderse a la mama adecuadamente es la mejor manera de evitar que se sienta molesto por tragar aire mientras se alimenta. Signos de que el beb se ha prendido adecuadamente al pezn:    Tironea o succiona de modo silencioso, sin causarle dolor.  Se escucha que traga cada 3 o 4 succiones.   Hay movimientos musculares por arriba y por delante de sus odos al succionar. Signos de que el beb no se ha prendido adecuadamente al pezn:   Hace ruidos de succin o de chasquido mientras se alimenta.  Siente dolor en el pezn. Si cree que el beb no se prendi correctamente, deslice el dedo en la comisura de la boca y colquelo entre las encas del beb para interrumpir la succin. Intente comenzar a amamantar nuevamente. Signos de lactancia materna exitosa Signos del beb:   Disminuye gradualmente el nmero de succiones o cesa la succin por completo.  Se duerme.  Relaja el cuerpo.  Retiene una pequea cantidad de leche en la boca.  Se desprende solo del pecho. Signos que presenta usted:  Las mamas han aumentado la firmeza, el peso y el tamao 1 a 3 horas despus de amamantar.  Estn ms blandas inmediatamente despus de amamantar.  Un aumento del volumen de leche, y tambin un cambio en su consistencia y color se producen hacia el quinto da de lactancia materna.  Los pezones no duelen, ni estn agrietados ni sangran. Signos de que su beb recibe la cantidad de leche suficiente  Moja al menos 3 paales en 24 horas. La orina debe ser clara y de color amarillo plido a los 5 das de vida.  Defeca al menos 3 veces en 24 horas a los 5 das de vida. La materia fecal debe ser blanda y amarillenta.  Defeca al menos 3 veces en 24 horas a los 7 das de vida. La materia fecal debe ser grumosa y amarillenta.  No registra una prdida de peso mayor del 10% del peso al nacer durante los primeros 3 das de vida.    Aumenta de peso un promedio de 4 a 7onzas (113 a 198g) por semana despus de los 4 das de vida.  Aumenta de peso, diariamente, de manera uniforme a partir de los 5 das de vida, sin registrar prdida de peso despus de las 2semanas de vida. Despus de  alimentarse, es posible que el beb regurgite una pequea cantidad. Esto es frecuente. FRECUENCIA Y DURACIN DE LA LACTANCIA MATERNA El amamantamiento frecuente la ayudar a producir ms leche y a prevenir problemas de dolor en los pezones e hinchazn en las mamas. Alimente al beb cuando muestre signos de hambre o si siente la necesidad de reducir la congestin de las mamas. Esto se denomina "lactancia a demanda". Evite el uso del chupete mientras trabaja para establecer la lactancia (las primeras 4 a 6 semanas despus del nacimiento del beb). Despus de este perodo, podr ofrecerle un chupete. Las investigaciones demostraron que el uso del chupete durante el primer ao de vida del beb disminuye el riesgo de desarrollar el sndrome de muerte sbita del lactante (SMSL). Permita que el nio se alimente en cada mama todo lo que desee. Contine amamantando al beb hasta que haya terminado de alimentarse. Cuando el beb se desprende o se queda dormido mientras se est alimentando de la primera mama, ofrzcale la segunda. Debido a que, con frecuencia, los recin nacidos permanecen somnolientos las primeras semanas de vida, es posible que deba despertar al beb para alimentarlo. Los horarios de lactancia varan de un beb a otro. Sin embargo, las siguientes reglas pueden servir como gua para ayudarla a garantizar que el beb se alimenta adecuadamente:  Se puede amamantar a los recin nacidos (bebs de 4 semanas o menos de vida) cada 1 a 3 horas.  No deben transcurrir ms de 3 horas durante el da o 5 horas durante la noche sin que se amamante a los recin nacidos.  Debe amamantar al beb 8 veces como mnimo en un perodo de 24 horas, hasta que comience a introducir slidos en su dieta, a los 6 meses de vida aproximadamente. EXTRACCIN DE LECHE MATERNA La extraccin y el almacenamiento de la leche materna le permiten asegurarse de que el beb se alimente exclusivamente de leche materna, aun en momentos en  los que no puede amamantar. Esto tiene especial importancia si debe regresar al trabajo en el perodo en que an est amamantando o si no puede estar presente en los momentos en que el beb debe alimentarse. Su asesor en lactancia puede orientarla sobre cunto tiempo es seguro almacenar leche materna.  El sacaleche es un aparato que le permite extraer leche de la mama a un recipiente estril. Luego, la leche materna extrada puede almacenarse en un refrigerador o congelador. Algunos sacaleches son manuales, mientras que otros son elctricos. Consulte a su asesor en lactancia qu tipo ser ms conveniente para usted. Los sacaleches se pueden comprar; sin embargo, algunos hospitales y grupos de apoyo a la lactancia materna alquilan sacaleches mensualmente. Un asesor en lactancia puede ensearle cmo extraer leche materna manualmente, en caso de que prefiera no usar un sacaleche.  CMO CUIDAR LAS MAMAS DURANTE LA LACTANCIA MATERNA Los pezones se secan, agrietan y duelen durante la lactancia materna. Las siguientes recomendaciones pueden ayudarla a mantener las mamas humectadas y sanas:  Evite usar jabn en los pezones.  Use un sostn de soporte. Aunque no son esenciales, las camisetas sin mangas o los sostenes especiales para amamantar estn diseados para acceder fcilmente a las mamas, para amamantar sin tener que quitarse todo   el sostn o la camiseta. Evite usar sostenes con aro o sostenes muy ajustados.  Seque al aire sus pezones durante 3 a 4minutos despus de amamantar al beb.  Utilice solo apsitos de algodn en el sostn para absorber las prdidas de leche. La prdida de un poco de leche materna entre las tomas es normal.  Utilice lanolina sobre los pezones luego de amamantar. La lanolina ayuda a mantener la humedad normal de la piel. Si usa lanolina pura, no tiene que lavarse los pezones antes de volver a alimentar al beb. La lanolina pura no es txica para el beb. Adems, puede extraer  manualmente algunas gotas de leche materna y masajear suavemente esa leche sobre los pezones, para que la leche se seque al aire. Durante las primeras semanas despus de dar a luz, algunas mujeres pueden experimentar hinchazn en las mamas (congestin mamaria). La congestin puede hacer que sienta las mamas pesadas, calientes y sensibles al tacto. El pico de la congestin ocurre dentro de los 3 a 5 das despus del parto. Las siguientes recomendaciones pueden ayudarla a aliviar la congestin:  Vace por completo las mamas al amamantar o extraer leche. Puede aplicar calor hmedo en las mamas (en la ducha o con toallas hmedas para manos) antes de amamantar o extraer leche. Esto aumenta la circulacin y ayuda a que la leche fluya. Si el beb no vaca por completo las mamas cuando lo amamanta, extraiga la leche restante despus de que haya finalizado.  Use un sostn ajustado (para amamantar o comn) o una camiseta sin mangas durante 1 o 2 das para indicar al cuerpo que disminuya ligeramente la produccin de leche.  Aplique compresas de hielo sobre las mamas, a menos que le resulte demasiado incmodo.  Asegrese de que el beb est prendido y se encuentre en la posicin correcta mientras lo alimenta. Si la congestin persiste luego de 48 horas o despus de seguir estas recomendaciones, comunquese con su mdico o un asesor en lactancia. RECOMENDACIONES GENERALES PARA EL CUIDADO DE LA SALUD DURANTE LA LACTANCIA MATERNA  Consuma alimentos saludables. Alterne comidas y colaciones, y coma 3 de cada una por da. Dado que lo que come afecta la leche materna, es posible que algunas comidas hagan que su beb se vuelva ms irritable de lo habitual. Evite comer este tipo de alimentos si percibe que afectan de manera negativa al beb.  Beba leche, jugos de fruta y agua para satisfacer su sed (aproximadamente 10 vasos al da).  Descanse con frecuencia, reljese y tome sus vitaminas prenatales para evitar la  fatiga, el estrs y la anemia.  Contine con los autocontroles de la mama.  Evite masticar y fumar tabaco.  Evite el consumo de alcohol y drogas. Algunos medicamentos, que pueden ser perjudiciales para el beb, pueden pasar a travs de la leche materna. Es importante que consulte a su mdico antes de tomar cualquier medicamento, incluidos todos los medicamentos recetados y de venta libre, as como los suplementos vitamnicos y herbales. Puede quedar embarazada durante la lactancia. Si desea controlar la natalidad, consulte a su mdico cules son las opciones ms seguras para el beb. SOLICITE ATENCIN MDICA SI:   Usted siente que quiere dejar de amamantar o se siente frustrada con la lactancia.  Siente dolor en las mamas o en los pezones.  Sus pezones estn agrietados o sangran.  Sus pechos estn irritados, sensibles o calientes.  Tiene un rea hinchada en cualquiera de las mamas.  Siente escalofros o fiebre.  Tiene nuseas o vmitos.    Presenta una secrecin de otro lquido distinto de la leche materna de los pezones.  Sus mamas no se llenan antes de Museum/gallery exhibitions officeramamantar al beb para el quinto da despus del Laporteparto.  Se siente triste y deprimida.  El beb est demasiado somnoliento como para comer bien.  El beb tiene problemas para dormir.  Moja menos de 3 paales en 24 horas.  Defeca menos de 3 veces en 24 horas.  La piel del beb o la parte blanca de los ojos se vuelven amarillentas.  El beb no ha aumentado de [place]o a los [address] das de [place]. SOLICITE ATENCIN MDICA DE INMEDIATO SI:   El beb est muy cansado Retail buyer(letargo) y no se quiere despertar para comer.  Le sube la fiebre sin causa. Document Released: 02/24/2005 Document Revised: 03/01/2013 [facility]ExitCare Patient Information 2015 [place]ExitCare, [place]LL[place]. This information is not intended to replace advice given to you by your health care provider. Make sure you discuss any questions you have with your health care provider.  Informacin  sobre Government social research officerel parto prematuro  (Preterm Labor Information) El parto prematuro comienza antes de la semana 37 de [place] Marketembarazo. La duracin de un embarazo normal es de 39 a 41 semanas.  CAUSAS  Generalmente no hay una causa que pueda identificarse del motivo por el que una mujer comienza un trabajo de parto prematuro. Sin embargo, una de las causas conocidas ms frecuentes son las infecciones. Las infecciones del tero, el cuello, la vagina, el lquido Silveradoamnitico, la vejiga, los riones y Teacher, adult educationhasta de los pulmones (neumona) pueden hacer que el trabajo de parto se inicie. Otras causas que pueden sospecharse son:   Infecciones urogenitales, como infecciones por hongos y vaginosis bacteriana.   Anormalidades uterinas (forma del tero, sptum uterino, fibromas, hemorragias en la placenta).   Un cuello que ha sido operado (puede ser que no permanezca cerrado).   Malformaciones del feto.   Gestaciones mltiples (mellizos, trillizos y ms).   Ruptura del saco amnitico.  FACTORES DE RIESGO   Historia previa de parto prematuro.   Tener ruptura prematura de las membranas (RPM).   La placenta cubre la abertura del cuello (placenta previa).   La placenta se separa del tero (abrupcin placentaria).   El cuello es demasiado dbil para contener al beb en el tero (cuello incompetente).   Hay mucho lquido en el saco amnitico (polihidramnios).   Consumo de drogas o hbito de fumar durante Firefighterel embarazo.   No aumentar de peso lo suficiente durante el [place]arazo.   Mujeres menores de 18 aos o mayores de [address] [place] aos.   Nivel socioeconmico bajo.   Pertenecer a Engineer, productionla raza afroamericana. SNTOMAS  Los signos y sntomas del trabajo de parto prematuro son:   Public librarianClicos similares a los Designer, jewellerymenstruales, dolor abdominal o dolor de espalda.  Contracciones uterinas regulares, tan frecuentes como seis por hora, sin importar su intensidad (pueden ser suaves o dolorosas).  Contracciones que comienzan en la parte  superior del tero y se expanden hacia abajo, a la zona inferior del abdomen y la espalda.   Sensacin de aumento de presin en la pelvis.   Aparece una secrecin acuosa o sanguinolenta por la vagina.  TRATAMIENTO  Segn el tiempo del embarazo y otras La Palmacircunstancias, el mdico puede indicar reposo en cama. Si es necesario, le indicarn medicamentos para TEFL teacherdetener las contracciones y para Customer service managermadurar los pulmones del feto. Si el trabajo de parto se inicia antes de las 34 semanas de [place]barazo, se recomienda la hospitalizacin. El tratamiento depende de las condiciones en que  se encuentren usted y el feto.  QU DEBE HACER SI PIENSA QUE EST EN TRABAJO DE PARTO PREMATURO?  Comunquese con su mdico inmediatamente. Debe concurrir al hospital para ser controlada inmediatamente.  CMO PUEDE EVITAR EL TRABAJO DE PARTO PREMATURO EN FUTUROS EMBARAZOS?  Usted debe:   Si fuma, abandonar el hbito.  Mantener un peso saludable y evitar sustancias qumicas y drogas innecesarias.  Controlar todo tipo de infeccin.  Informe a su mdico si tiene una historia conocida de trabajo de parto prematuro. Document Released: 06/03/2007 Document Revised: 10/27/2012 [facility] Patient Information 2015 [place]aidExitCare, MarylandLLC. This information is not intended to replace advice given to you by your health care provider. Make sure you discuss any questions you have with your health care provider.

## 2014-02-06 NOTE — Progress Notes (Signed)
NST reviewed and reactive. Spanish interpreter: [name] [name] used FBS 52-85 2 hour pp 58-120 U/s last week showed 2394 gms 5 lb 4 oz 82% with AC > 97% Transverse lie--discussed

## 2014-02-09 ENCOUNTER — Ambulatory Visit (INDEPENDENT_AMBULATORY_CARE_PROVIDER_SITE_OTHER): Payer: Self-pay | Admitting: *Deleted

## 2014-02-09 VITALS — BP 110/59 | HR 81

## 2014-02-09 DIAGNOSIS — O24419 Gestational diabetes mellitus in pregnancy, unspecified control: Secondary | ICD-10-CM

## 2014-02-09 DIAGNOSIS — O403XX1 Polyhydramnios, third trimester, fetus 1: Secondary | ICD-10-CM

## 2014-02-13 ENCOUNTER — Ambulatory Visit (INDEPENDENT_AMBULATORY_CARE_PROVIDER_SITE_OTHER): Payer: Self-pay | Admitting: Obstetrics and Gynecology

## 2014-02-13 ENCOUNTER — Encounter: Payer: Self-pay | Admitting: Obstetrics and Gynecology

## 2014-02-13 VITALS — BP 101/64 | HR 76 | Temp 76.0°F | Wt 168.5 lb

## 2014-02-13 DIAGNOSIS — O0992 Supervision of high risk pregnancy, unspecified, second trimester: Secondary | ICD-10-CM

## 2014-02-13 DIAGNOSIS — O403XX0 Polyhydramnios, third trimester, not applicable or unspecified: Secondary | ICD-10-CM

## 2014-02-13 DIAGNOSIS — O403XX1 Polyhydramnios, third trimester, fetus 1: Secondary | ICD-10-CM

## 2014-02-13 DIAGNOSIS — O24419 Gestational diabetes mellitus in pregnancy, unspecified control: Secondary | ICD-10-CM

## 2014-02-13 LAB — US OB FOLLOW UP

## 2014-02-13 LAB — POCT URINALYSIS DIP (DEVICE)
Bilirubin Urine: NEGATIVE
Glucose, UA: NEGATIVE mg/dL
HGB URINE DIPSTICK: NEGATIVE
NITRITE: NEGATIVE
PROTEIN: NEGATIVE mg/dL
Specific Gravity, Urine: 1.025 (ref 1.005–1.030)
UROBILINOGEN UA: 0.2 mg/dL (ref 0.0–1.0)
pH: 6.5 (ref 5.0–8.0)

## 2014-02-13 NOTE — Progress Notes (Signed)
C/o contractions for an total of 1 Hour q 10 min

## 2014-02-13 NOTE — Progress Notes (Signed)
Patient is doing well without complaints. She reports frequent contractions on Saturday which resolved. FM/PTL precautions reviewed CBGs well controlled on glyburide NST reviewed and reactive

## 2014-02-16 ENCOUNTER — Ambulatory Visit (INDEPENDENT_AMBULATORY_CARE_PROVIDER_SITE_OTHER): Payer: Self-pay | Admitting: *Deleted

## 2014-02-16 DIAGNOSIS — O24419 Gestational diabetes mellitus in pregnancy, unspecified control: Secondary | ICD-10-CM

## 2014-02-20 ENCOUNTER — Encounter: Payer: Self-pay | Attending: Obstetrics and Gynecology | Admitting: *Deleted

## 2014-02-20 ENCOUNTER — Ambulatory Visit (INDEPENDENT_AMBULATORY_CARE_PROVIDER_SITE_OTHER): Payer: Self-pay | Admitting: Obstetrics & Gynecology

## 2014-02-20 VITALS — BP 104/68 | HR 82 | Temp 98.5°F | Wt 171.1 lb

## 2014-02-20 DIAGNOSIS — O24419 Gestational diabetes mellitus in pregnancy, unspecified control: Secondary | ICD-10-CM | POA: Insufficient documentation

## 2014-02-20 DIAGNOSIS — O322XX1 Maternal care for transverse and oblique lie, fetus 1: Secondary | ICD-10-CM

## 2014-02-20 DIAGNOSIS — O403XX1 Polyhydramnios, third trimester, fetus 1: Secondary | ICD-10-CM

## 2014-02-20 DIAGNOSIS — Z713 Dietary counseling and surveillance: Secondary | ICD-10-CM | POA: Insufficient documentation

## 2014-02-20 DIAGNOSIS — O322XX0 Maternal care for transverse and oblique lie, not applicable or unspecified: Secondary | ICD-10-CM | POA: Insufficient documentation

## 2014-02-20 LAB — POCT URINALYSIS DIP (DEVICE)
Bilirubin Urine: NEGATIVE
Glucose, UA: 100 mg/dL — AB
HGB URINE DIPSTICK: NEGATIVE
Ketones, ur: NEGATIVE mg/dL
NITRITE: NEGATIVE
PROTEIN: NEGATIVE mg/dL
Specific Gravity, Urine: 1.025 (ref 1.005–1.030)
Urobilinogen, UA: 1 mg/dL (ref 0.0–1.0)
pH: 6.5 (ref 5.0–8.0)

## 2014-02-20 LAB — US OB FOLLOW UP

## 2014-02-20 NOTE — Progress Notes (Signed)
I have reset the date and time on [name] [name]'s glucometer. Her FBS this morning was 71mg /dl. While in office 3hpp reading of 43mg /dl. Pt stated she had a snack with her but was in the process of NST and didn't know if she could eat and test at that time. She dies symptoms of hypoglycemia yet hands are clammy. 6oz of Coke given, waited 15 minutes, retest with reading of 103mg /dl. Reviewed testing regimen, documentation.

## 2014-02-20 NOTE — Patient Instructions (Signed)
Regrese a la clinica cuando tenga su cita. Si tiene problemas o preguntas, llama a la clinica o vaya a la sala [facility] emergencia al [facility].    

## 2014-02-20 NOTE — Progress Notes (Signed)
Patient is Spanish-speaking only, Spanish interpreter present for this encounter. On BS review, all CBGs are within range on log book but all dinner PP end in 0 (100-120) On review of meter log, there are erroneous dates February to October. The values correspond to recorded values except for dinner PP.  Patient had 142 last night that was not recorded, but she records values that are within range like the fasting value this morning.  She maintains that she writes what she sees. Will take meter and log to DM educator to recalibrate. On further review, patient has about 2-3 elevated PP 130-140s; continue Glyburide 2.5 mg in am and 3.75 mg qhs. Patient advised to watch carbohydrate intake at dinner. Advised to take Sudafed or antihistamine for headaches associated with URI symptoms. NST performed today was reviewed and was found to be reactive.  AFI normal at 23.2 cm.  Continue recommended antenatal testing and prenatal care. Tranverse fetal malpresentation noted; if still present next week, need to discuss ECV vs cesarean delivery. No other complaints or concerns.  Labor and fetal movement precautions reviewed. Pelvic cultures next week.

## 2014-02-20 NOTE — Progress Notes (Signed)
Used Interpreter Elna Breslowarol Hernandez. C/o headaches for the past week, taking tylenol with relief, c/o cough , states it is getting better.

## 2014-02-22 ENCOUNTER — Encounter: Payer: Self-pay | Admitting: Family Medicine

## 2014-02-23 ENCOUNTER — Ambulatory Visit (INDEPENDENT_AMBULATORY_CARE_PROVIDER_SITE_OTHER): Payer: Self-pay | Admitting: *Deleted

## 2014-02-23 VITALS — BP 111/65 | HR 80

## 2014-02-23 DIAGNOSIS — O403XX1 Polyhydramnios, third trimester, fetus 1: Secondary | ICD-10-CM

## 2014-02-23 DIAGNOSIS — O24419 Gestational diabetes mellitus in pregnancy, unspecified control: Secondary | ICD-10-CM

## 2014-02-23 NOTE — Progress Notes (Signed)
12/17 NST reviewed and reactive 

## 2014-02-27 ENCOUNTER — Ambulatory Visit (HOSPITAL_COMMUNITY)
Admission: RE | Admit: 2014-02-27 | Discharge: 2014-02-27 | Disposition: A | Discharge status: Home / Self Care | Payer: Self-pay | Source: Ambulatory Visit | Attending: Obstetrics and Gynecology | Admitting: Obstetrics and Gynecology

## 2014-02-27 ENCOUNTER — Ambulatory Visit (INDEPENDENT_AMBULATORY_CARE_PROVIDER_SITE_OTHER): Payer: Self-pay | Admitting: Family Medicine

## 2014-02-27 VITALS — BP 110/69 | HR 80 | Wt 170.1 lb

## 2014-02-27 DIAGNOSIS — O24419 Gestational diabetes mellitus in pregnancy, unspecified control: Secondary | ICD-10-CM | POA: Insufficient documentation

## 2014-02-27 DIAGNOSIS — O24913 Unspecified diabetes mellitus in pregnancy, third trimester: Secondary | ICD-10-CM | POA: Insufficient documentation

## 2014-02-27 DIAGNOSIS — O403XX1 Polyhydramnios, third trimester, fetus 1: Secondary | ICD-10-CM

## 2014-02-27 LAB — POCT URINALYSIS DIP (DEVICE)
Bilirubin Urine: NEGATIVE
Glucose, UA: NEGATIVE mg/dL
HGB URINE DIPSTICK: NEGATIVE
KETONES UR: NEGATIVE mg/dL
Nitrite: NEGATIVE
Protein, ur: NEGATIVE mg/dL
SPECIFIC GRAVITY, URINE: 1.02 (ref 1.005–1.030)
UROBILINOGEN UA: 0.2 mg/dL (ref 0.0–1.0)
pH: 6.5 (ref 5.0–8.0)

## 2014-02-27 LAB — OB RESULTS CONSOLE GBS: GBS: NEGATIVE

## 2014-02-27 NOTE — Patient Instructions (Signed)
Tercer trimestre del embarazo  (Third Trimester of Pregnancy)  El tercer trimestre del embarazo abarca desde la semana 29 hasta la semana 42, desde el 7 mes hasta el 9. En este trimestre el beb (feto) se desarrolla muy rpidamente. Hacia el final del noveno mes, el beb que an no ha nacido mide alrededor de 20 pulgadas (45 cm) de largo. Y pesa entre 6 y 10 libras (2,700 y 4,500 kg).  CUIDADOS EN EL HOGAR   Evite fumar, consumir hierbas y beber alcohol. Evite los frmacos que no apruebe el mdico.  Slo tome los medicamentos que le haya indicado su mdico. Algunos medicamentos son seguros para tomar durante el embarazo y otros no lo son.  Haga ejercicios slo como le indique el mdico. Deje de hacer ejercicios si comienza a tener clicos.  Haga comidas regulares y sanas.  Use un sostn que le brinde buen soporte si sus mamas estn sensibles.  No utilice la baera con agua caliente, baos turcos y saunas.  Colquese el cinturn de seguridad cuando conduzca.  Evite comer carne cruda y el contacto con los utensilios y desperdicios de los gatos.  Tome las vitaminas indicadas para la etapa prenatal.  Trate de tomar medicamentos para mover el intestino (laxantes) segn lo necesario y si su mdico la autoriza. Consuma ms fibra comiendo frutas y vegetales frescos y granos enteros. Beba gran cantidad de lquido para mantener el pis (orina) de tono claro o amarillo plido.  Tome baos de agua tibia (baos de asiento) para calmar el dolor o las molestias causadas por las hemorroides. Use una crema para las hemorroides si el mdico la autoriza.  Si tiene venas hinchadas y abultadas (venas varicosas), use medias de soporte. Eleve (levante) los pies durante 15 minutos, 3 o 4 veces por da. Limite el consumo de sal en su dieta.  Evite levantar objetos pesados, usar tacones altos y sintese derecha.  Descanse con las piernas elevadas si tiene calambres o dolor de cintura.  Visite a su dentista  si no lo ha hecho durante el embarazo. Use un cepillo de dientes blando para higienizarse los dientes. Use suavemente el hilo dental.  Puede tener sexo (relaciones sexuales) siempre que el mdico la autorice.  No viaje por largas distancias si puede evitarlo. Slo hgalo con la aprobacin de su mdico.  Haga el curso pre parto.  Practique conducir hasta el hospital.  Prepare el bolso que llevar.  Prepare la habitacin del beb.  Concurra a los controles mdicos. SOLICITE AYUDA SI:   No est segura si est en trabajo de parto o ha roto la bolsa de aguas.  Tiene mareos.  Siente clicos intensos o presin en la zona baja del vientre (abdomen).  Siente un dolor persistente en la zona del vientre.  Tiene malestar estomacal (nuseas), devuelve (vomita), o tiene deposiciones acuosas (diarrea).  Advierte un olor ftido que proviene de la vagina.  Siente dolor al hacer pis (orinar). SOLICITE AYUDA DE INMEDIATO SI:   Tiene fiebre.  Pierde lquido o sangre por la vagina.  Tiene sangrando o pequeas prdidas vaginales.  Siente dolor intenso o clicos en el abdomen.  Sube o baja de peso rpidamente.  Tiene dificultad para respirar o siente dolor en el pecho.  Sbitamente se le hinchan el rostro, las manos, los tobillos, los pies o las piernas.  No ha sentido los movimientos del beb durante una hora.  Siente un dolor de cabeza intenso que no se alivia con medicamentos.  Su visin se modifica. Document   Released: 10/27/2012 ExitCare Patient Information 2015 ExitCare, LLC. This information is not intended to replace advice given to you by your health care provider. Make sure you discuss any questions you have with your health care provider.  

## 2014-02-27 NOTE — Progress Notes (Signed)
NST: Category 1 tracing with baseline in 140s.  Moderate variability, multiple accelerations, no decelerations. Fasting CBGs: 6/7 within range.   2 hr PP: 3/21 within range.  Labor reviewed.

## 2014-02-28 DIAGNOSIS — O09529 Supervision of elderly multigravida, unspecified trimester: Secondary | ICD-10-CM | POA: Insufficient documentation

## 2014-02-28 DIAGNOSIS — Z3A36 36 weeks gestation of pregnancy: Secondary | ICD-10-CM | POA: Insufficient documentation

## 2014-02-28 LAB — GC/CHLAMYDIA PROBE AMP
CT Probe RNA: NEGATIVE
GC PROBE AMP APTIMA: NEGATIVE

## 2014-03-01 ENCOUNTER — Telehealth (HOSPITAL_COMMUNITY): Payer: Self-pay | Admitting: *Deleted

## 2014-03-01 ENCOUNTER — Telehealth: Payer: Self-pay | Admitting: *Deleted

## 2014-03-01 NOTE — Telephone Encounter (Signed)
Preadmission screen  

## 2014-03-01 NOTE — Telephone Encounter (Signed)
Called pt today @ 1020 w/interpreter [employer]saquel Mora.  I advised pt that her appt for 12/28 has been changed.  She will need to come to hospital @ 0700 for a procedure because her baby is not in the correct position for vaginal birth.  She should not have anything to eat or drink after midnight Sunday except water. The doctor will explain the procedure in detail at the time of her appt. This appt will take the place of her clinic appt for that Charday Capetillo.  She will also need to return to clinic on 12/31 @ 0830 for Ob visit and NST. Pt voiced understanding of instructions given.

## 2014-03-02 LAB — CULTURE, BETA STREP (GROUP B ONLY)

## 2014-03-06 ENCOUNTER — Observation Stay (HOSPITAL_COMMUNITY)
Admission: RE | Admit: 2014-03-06 | Discharge: 2014-03-06 | Disposition: A | Discharge status: Home / Self Care | Payer: Self-pay | Source: Ambulatory Visit | Attending: Family Medicine | Admitting: Family Medicine

## 2014-03-06 ENCOUNTER — Other Ambulatory Visit: Payer: Self-pay

## 2014-03-06 VITALS — BP 120/81 | HR 71 | Resp 20

## 2014-03-06 DIAGNOSIS — O322XX0 Maternal care for transverse and oblique lie, not applicable or unspecified: Principal | ICD-10-CM | POA: Insufficient documentation

## 2014-03-06 DIAGNOSIS — Z3A37 37 weeks gestation of pregnancy: Secondary | ICD-10-CM | POA: Insufficient documentation

## 2014-03-06 DIAGNOSIS — O322XX3 Maternal care for transverse and oblique lie, fetus 3: Secondary | ICD-10-CM

## 2014-03-06 DIAGNOSIS — O24419 Gestational diabetes mellitus in pregnancy, unspecified control: Secondary | ICD-10-CM | POA: Insufficient documentation

## 2014-03-06 DIAGNOSIS — O403XX0 Polyhydramnios, third trimester, not applicable or unspecified: Secondary | ICD-10-CM | POA: Insufficient documentation

## 2014-03-06 NOTE — Discharge Summary (Signed)
  This patient was seen as an outpatient.  She did not have her version, so was discharged home .  She does not require a discharge summary.  Aviva SignsMarie L Bambi Fehnel, CNM

## 2014-03-06 NOTE — Progress Notes (Signed)
Pt here for version, bedside unltrasound performed by marie williams cnm, pt vertex, to get NST and then discharge pt to home

## 2014-03-06 NOTE — H&P (Signed)
[name] MoatsLucila F [name] is a 38 y.o. female presenting for version. This is a 38 y.o. female at 5934w4d who presents for version due to Transverse lie.  She has been followed in the High Risk clinic since 27 weeks for gestational diabetes. She has been controlled on diet so far. She also has developed polyhydramnios since 28 weeks.    Maternal Medical History:  Reason for admission: Nausea.  Fetal activity: Perceived fetal activity is normal.   Last perceived fetal movement was within the past hour.    Prenatal complications: Polyhydramnios.   Prenatal Complications - Diabetes: gestational.    OB History    Gravida Para Term Preterm AB TAB SAB Ectopic Multiple Living   5 3 3  1  1   3      Past Medical History  Diagnosis Date  . Gestational diabetes   . GDM (gestational diabetes mellitus) 12/26/2013   No past surgical history on file. Family History: family history includes Diabetes in her father. Social History:  reports that she has never smoked. She has never used smokeless tobacco. She reports that she does not drink alcohol or use illicit drugs.   Prenatal Transfer Tool  Maternal Diabetes: Yes:  Diabetes Type:  Diet controlled Genetic Screening: Normal Maternal Ultrasounds/Referrals: Normal Fetal Ultrasounds or other Referrals:  None Maternal Substance Abuse:  No Significant Maternal Medications:  None Significant Maternal Lab Results:  None Other Comments:  None  Review of Systems  Constitutional: Negative for fever, chills and malaise/fatigue.  Cardiovascular: Negative for leg swelling.  Gastrointestinal: Negative for nausea, vomiting and abdominal pain.    Exam by:: m Dontay Harm cnm via bedside ultrasound Blood pressure 120/81, pulse 71, resp. rate 20. Maternal Exam:  Uterine Assessment: Contraction strength is mild.  Contraction frequency is rare.   Abdomen: Fundal height is 37.   Estimated fetal weight is 7.   Fetal presentation: vertex  Introitus: Normal vulva.  Normal vagina.  Ferning test: not done.  Nitrazine test: not done. Amniotic fluid character: not assessed.  Pelvis: adequate for delivery.   Cervix: Cervix evaluated by digital exam.     Fetal Exam Fetal Monitor Review: Mode: ultrasound.   Baseline rate: 135.  Variability: moderate (6-25 bpm).   Pattern: accelerations present and no decelerations.    Fetal State Assessment: Category I - tracings are normal.     Physical Exam  Constitutional: She is oriented to person, place, and time. She appears well-developed and well-nourished. No distress.  HENT:  Head: Normocephalic.  Cardiovascular: Normal rate.   Respiratory: Effort normal.  GI: Soft. She exhibits no distension. There is no tenderness. There is no rebound and no guarding.  Musculoskeletal: Normal range of motion.  Neurological: She is alert and oriented to person, place, and time.  Skin: Skin is warm and dry.  Psychiatric: She has a normal mood and affect.    Bedside US done. Vertex is visible as presenting part.  FHR reactive  Prenatal labs: ABO, Rh: O/Positive/-- (06/08 0000) Antibody: Negative (06/08 0000) Rubella: Immune (06/08 0000) RPR: NON REAC (10/19 0951)  HBsAg: Negative (06/08 0000)  HIV: NONREACTIVE (10/19 0951)  GBS:     Assessment/Plan: A:  SIUP at 3634w4d       Previous transverse lie      Now had vertex presentation with  Longitudinal lie      GDM      Polyhydramnios  P:  Discharge home       Followup in clinic   [facility]leWILLIAMS,Juergen Hardenbrook 03/06/2014,  8:56 AM

## 2014-03-06 NOTE — Progress Notes (Signed)
Discharge instructions given, pt verbalized and understands

## 2014-03-06 NOTE — Discharge Instructions (Signed)
Parto vaginal (Vaginal Delivery) Durante el parto, el mdico la ayudar a dar a luz a su beb. En elparto vaginal, deber pujar para que el beb salga por la vagina. Sin embargo, antes de que pueda sacar al beb, es necesario que ocurran ciertas cosas. La abertura del tero (cuello del tero) tiene que ablandarse, hacerse ms delgado y abrirse (dilatar) hasta que llegue a 10 cm. Adems, el beb tiene que bajar desde el tero a la vagina. SIGNOS DE TRABAJO DE PARTO  El mdico tendr primero que asegurarse de que usted est en trabajo de parto. Algunos signos son:   Eliminar lo que se llama tapn mucoso antes del inicio del trabajo de parto. Este es una pequea cantidad de mucosidad teida con sangre.  Tener contracciones uterinas regulares y dolorosas.   El tiempo entre las contracciones debe acortarse  Las molestias y el dolor se harn ms intensos gradualmente.  El dolor de las contracciones empeora al caminar y no se alivia con el reposo.   El cuello del tero se hace mas delgado (se borra) y se dilata. ANTES DEL PARTO Una vez que se inicie el trabajo de parto y sea admitida en el hospital o sanatorio, el mdico podr hacer lo siguiente:   Realizar un examen fsico.  Controlar si hay complicaciones relacionadas con el trabajo de parto.  Verificar su presin arterial, temperatura y pulso y la frecuencia cardaca (signos vitales).   Determinar si se ha roto el saco amnitico y cundo ha ocurrido.  Realizar un examen vaginal (utilizando un guante estril y un lubricante) para determinar:  La posicin (presentacin) del beb. El beb se presenta con la cabeza primero (vertex) en el canal de parto (vagina), o estn los pies o las nalgas primero (de nalgas)?  El nivel (estacin) de la cabeza del beb dentro del canal de parto.  El borramiento y la dilatacin del cuello uterino  El monitor fetal electrnico generalmente se coloca sobre el abdomen al llegar. Se utiliza para  controlar las contracciones y la frecuencia cardaca del beb.  Cuando el monitor est en el abdomen (monitor fetal externo), slo toma la frecuencia y la duracin de las contracciones. No informa acerca de la intensidad de las contracciones.  Si el mdico necesita saber exactamente la intensidad de las contracciones o cul es la frecuencia cardaca del beb, colocar un monitor interno en la vagina y el tero. El mdico comentar los riesgos y los beneficios de usar un monitor interno y le pedir autorizacin antes de colocar el dispositivo.  El monitoreo fetal continuo ser necesario si le han aplicado una epidural, si le administran ciertos medicamentos (como oxitocina) y si tiene complicaciones del embarazo o del trabajo de parto.  Podrn colocarle una va intravenosa en una vena del brazo para suministrarle lquidos y medicamentos, si es necesario. TRES ETAPAS DEL TRABAJO DE PARTO Y EL PARTO El trabajo de parto y el parto normales se dividen en tres etapas. Primera etapa Esta etapa comienza cuando comienzan las contracciones regulares y el cuello comienza a borrarse y dilatarse. Finaliza cuando el cuello est completamente abierto (completamente dilatado). La primera etapa es la etapa ms larga del trabajo de parto y puede durar desde 3 horas a 15 horas.  Algunos mtodos estn disponibles para ayudar con el dolor del parto. Usted y su mdico decidirn qu opcin es la mejor para usted. Las opciones incluyen:   Medicamentos narcticos. Estos son medicamentos fuertes que usted puede recibir a travs de una va intravenosa o   como inyeccin en el msculo. Estos medicamentos alivian el dolor pero no hacen que desaparezca completamente.  Epidural. Se administra un medicamento a travs de un tubo delgado que se inserta en la espalda. El medicamento adormece la parte inferior del cuerpo y evita el dolor en esa zona.  Bloqueo paracervical Es una inyeccin de un anestsico en cada lado del cuello  uterino.  Usted podr pedir un parto natural, que implica que no se usen analgsicos ni epidural durante el parto y el trabajo de parto. En cambio, podr tener otro tipo de ayuda como ejercicios respiratorios para hacer frente al dolor. Segunda etapa La segunda etapa del trabajo de parto comienza cuando el cuello se ha dilatado completamente a 10 cm. Contina hasta que usted puja al beb hacia abajo, por el canal de parto, y el beb nace. Esta etapa puede durar slo algunos minutos o algunas horas.  La posicin del la cabeza del beb a medida que pasa por el canal de parto, es informada como un nmero, llamado estacin. Si la cabeza del beb no ha iniciado su descenso, la estacin se describe como que est en menos 3 (-3). Cuando la cabeza del beb est en la estacin cero, est en el medio del canal de parto y se encaja en la pelvis. La estacin en la que se encuentra el beb indica el progreso de la segunda etapa del trabajo de parto.  Cuando el beb nace, el mdico lo sostendr con la cabeza hacia abajo para evitar que el lquido amnitico, el moco y la sangre entren en los pulmones del beb. La boca y la nariz del beb podrn ser succionadas con un pequeo bulbo para retirar todo lquido adicional.  El mdico podr colocar al beb sobre su estmago. Es importante evitar que el beb tome fro. Para hacerlo, el mdico secar al beb, lo colocar directamente sobre su piel, (sin mantas entre usted y el beb) y lo cubrir con mantas secas y tibias.  Se corta el cordn umbilical. Tercera etapa Durante la tercera etapa del trabajo de parto, el mdico sacar la placenta (alumbramiento) y se asegurar de que el sangrado est controlado. La salida de la placenta generalmente demora 5 minutos pero puede tardar hasta 30 minutos. Luego de la salida de la placenta, le darn un medicamento por va intravenosa o inyectable para ayudar a contraer el tero y controlar el sangrado. Si planea amamantar al beb,  puede intentar en este momento Luego de la salida de la placenta, el tero debe contraerse y quedar muy firme. Si el tero no queda firme, el mdico lo masajear. Esto es importante debido a que la contraccin del tero ayuda a cortar el sangrado en el sitio en que la placenta estaba unida al tero. Si el tero no se contrae adecuadamente ni permanece firme, podr causar un sangrado abundante. Si hay mucho sangrado, podrn darle medicamentos para contraer el tero y detener el sangrado.  Document Released: 02/07/2008 Document Revised: 07/11/2013 ExitCare Patient Information 2015 ExitCare, LLC. This information is not intended to replace advice given to you by your health care provider. Make sure you discuss any questions you have with your health care provider.  

## 2014-03-08 ENCOUNTER — Ambulatory Visit (INDEPENDENT_AMBULATORY_CARE_PROVIDER_SITE_OTHER): Payer: Self-pay | Admitting: Physician Assistant

## 2014-03-08 VITALS — BP 110/63 | HR 72 | Temp 97.6°F | Wt 169.5 lb

## 2014-03-08 DIAGNOSIS — O24419 Gestational diabetes mellitus in pregnancy, unspecified control: Secondary | ICD-10-CM

## 2014-03-08 DIAGNOSIS — O0992 Supervision of high risk pregnancy, unspecified, second trimester: Secondary | ICD-10-CM

## 2014-03-08 DIAGNOSIS — O09523 Supervision of elderly multigravida, third trimester: Secondary | ICD-10-CM

## 2014-03-08 DIAGNOSIS — O403XX1 Polyhydramnios, third trimester, fetus 1: Secondary | ICD-10-CM

## 2014-03-08 LAB — POCT URINALYSIS DIP (DEVICE)
BILIRUBIN URINE: NEGATIVE
Glucose, UA: NEGATIVE mg/dL
Hgb urine dipstick: NEGATIVE
KETONES UR: NEGATIVE mg/dL
Nitrite: NEGATIVE
Protein, ur: NEGATIVE mg/dL
Specific Gravity, Urine: 1.025 (ref 1.005–1.030)
Urobilinogen, UA: 0.2 mg/dL (ref 0.0–1.0)
pH: 6 (ref 5.0–8.0)

## 2014-03-08 LAB — US OB FOLLOW UP

## 2014-03-08 NOTE — Progress Notes (Signed)
37 weeks, denies LOF, dysuria, vaginal bleeding.  Endorses good fetal movement.   NO complaints.  She shared her blood sugar diary which indicates increased blood sugar around the holiday festivities.  Now improved   Dietary counselling provided.   IOL scheduled as well as NST.twice weekly testing.   OBF 1 week

## 2014-03-08 NOTE — Progress Notes (Signed)
US for growth scheduled 1/4 per guideline.  IOL scheduled on 1/7 @ 0730.

## 2014-03-08 NOTE — Progress Notes (Signed)
Marta used for interpreter

## 2014-03-08 NOTE — Patient Instructions (Signed)
Lizbeth BarkLactancia materna y lactancia inducida (Breastfeeding and Inducing Lactation) La lactancia inducida es el uso de hormonas u otros medicamentos y de la estimulacin mamaria para ayudar a producir Sales promotion account executiveleche materna. Es aconsejable que pruebe la lactancia inducida si:  Adopta un beb.  Una madre sustituta est embarazada de su beb.  Debe dejar de amamantar por un lapso de tiempo. CMO ACTA? Durante el [place] Vandergriftembarazo, las hormonas se modifican para preparar al organismo para que produzca [place]leche materna. Despus del Psychiatristembarazo, las hormonas envan seales al organismo para que comience a producir leche materna para alimentar al beb. Si usted no atraviesa por CarMaxestos cambios, puede resultarle difcil producir la cantidad suficiente de leche materna para alimentar al beb. El mdico y Physiological scientistel asesor en lactancia pueden ayudarla a que produzca [place]che con medicamentos y con Diplomatic Services operational officerestimulacin mamaria.  PRODUCIR LA CANTIDAD DE LECHE SUFICIENTE PARA ALIMENTAR AL BEB? La lactancia inducida Dentistpuede resultar satisfactoria. Sin embargo, muy pocas mujeres que usaron la lactancia inducida para producir leche materna pueden producir toda la leche que sus bebs necesitan. Tal vez deba alimentar al beb con WPS Resourcesleche materna donada o con [address] Roadleche maternizada, adems de la Ianthaleche materna. Esto garantizar que el beb reciba la alimentacin [place]adecuada. Generalmente, la lactancia inducida es ms satisfactoria si ha UAL Corporationestado embarazada antes.  DE QU FORMA EL CUERPO PRODUCE LECHE? Para inducir la lactancia, comenzar a tomar hormonas 3 o 4meses antes de empezar a Museum/gallery exhibitions officeramamantar. Seguir tomndolas hasta unas 6semanas antes de la llegada del beb. Cuando deje de Enbridge Energytomar las hormonas, tendr que estimularse las mamas varias veces por da para Chartered certified accountantalentar la produccin de [place]leche materna. Para realizar la estimulacin [place]mamaria, puede frotarse y estirarse el tejido del pezn suavemente. La estimulacin mamaria tambin puede hacerse con una bomba mamaria elctrica doble de  hospital, para imitar la succin de un beb. Intente extraerse leche de ambas mamas cada 3horas (8veces por da) durante 20minutos. Si no puede hacerlo tantas veces por da, hgalo con la mayor frecuencia posible. Esto ayuda a que el organismo siga produciendo [place]leche. Cuando ponga al beb en la mama, es posible que el organismo tambin responda al [place] Parkolor, Oregonel sonido y la sensacin del beb al aumentar la cantidad de Utopialeche que usted produce.  El mdico tambin puede recetarle medicamentos para Psychiatric nurseestimular la Market researcherlactancia y aumentar el suministro de [place]leche. Adems, hay medicamentos herbales disponibles para inducir la lactancia. Es importante saber que estos medicamentos no cuentan con la aprobacin de la FDA ni estn regulados por este organismo. Consulte siempre al mdico antes de tomar algn medicamento herbal. QU MS DEBO SABER?  Solo debe tomar las hormonas y los medicamentos como le indic el mdico o el asesor capacitado Games developeren lactancia.  Hable con el mdico o el asesor en lactancia si necesita orientacin. Ellos podrn ayudarla a que empiece a producir WPS Resourcesleche y Journalist, newspaperla aconsejarn cuando tome decisiones importantes sobre cmo Corporate treasureralimentar al beb.  Intente utilizar un sistema de alimentacin suplementaria para proporcionarle al beb WPS Resourcesleche materna donada o leche maternizada extra en la mama mientras se [place]villealimenta. Esto garantiza que el beb reciba la nutricin [place] May[place] Islandsadecuada mientras lo est amamantando. Consulte a un especialista en lactancia para que la ayude a Clinical research associateencontrar y Visual merchandiserutilizar este dispositivo. Document Released: 08/13/2007 Document Revised: 03/01/2013 [facility]ExitCare Patient Information 2015 CarrizoExitCare, [place]LL[place]. This information is not intended to replace advice given to you by your health care provider. Make sure you discuss any questions you have with your health care provider.

## 2014-03-10 NOTE — L&D Delivery Note (Signed)
Patient is 39 y.o. [id number] 544w1d admitted A2/BDM, s/p ECV x 2 prior to induction.   Delivery Note At 4:01 PM a viable female was delivered via Vaginal, Spontaneous Delivery (Presentation: Left Occiput Anterior).  APGAR: 7, 9; weight 7 lb 15.9 oz (3626 g).   Placenta status: Intact, Spontaneous.  Cord: 3 vessels with the following complications: None.   Anesthesia: Epidural  Episiotomy: None Lacerations: None Suture Repair: n/a Est. Blood Loss (mL): 400  Mom to postpartum.  Baby to Couplet care / Skin to Skin.  Jaizon Deroos ROCIO 03/17/2014, 4:44 PM

## 2014-03-13 ENCOUNTER — Other Ambulatory Visit: Payer: Self-pay

## 2014-03-13 ENCOUNTER — Encounter: Payer: Self-pay | Admitting: *Deleted

## 2014-03-13 ENCOUNTER — Ambulatory Visit (HOSPITAL_COMMUNITY)
Admission: RE | Admit: 2014-03-13 | Discharge: 2014-03-13 | Disposition: A | Discharge status: Home / Self Care | Payer: Self-pay | Source: Ambulatory Visit | Attending: Obstetrics and Gynecology | Admitting: Obstetrics and Gynecology

## 2014-03-13 ENCOUNTER — Ambulatory Visit (INDEPENDENT_AMBULATORY_CARE_PROVIDER_SITE_OTHER): Payer: Self-pay | Admitting: Family Medicine

## 2014-03-13 VITALS — BP 119/78 | HR 74 | Temp 97.4°F | Wt 170.1 lb

## 2014-03-13 DIAGNOSIS — O24419 Gestational diabetes mellitus in pregnancy, unspecified control: Secondary | ICD-10-CM

## 2014-03-13 DIAGNOSIS — O0992 Supervision of high risk pregnancy, unspecified, second trimester: Secondary | ICD-10-CM

## 2014-03-13 DIAGNOSIS — O09523 Supervision of elderly multigravida, third trimester: Secondary | ICD-10-CM | POA: Insufficient documentation

## 2014-03-13 DIAGNOSIS — O24410 Gestational diabetes mellitus in pregnancy, diet controlled: Secondary | ICD-10-CM | POA: Insufficient documentation

## 2014-03-13 DIAGNOSIS — Z3A38 38 weeks gestation of pregnancy: Secondary | ICD-10-CM | POA: Insufficient documentation

## 2014-03-13 DIAGNOSIS — O403XX0 Polyhydramnios, third trimester, not applicable or unspecified: Secondary | ICD-10-CM | POA: Insufficient documentation

## 2014-03-13 DIAGNOSIS — O403XX1 Polyhydramnios, third trimester, fetus 1: Secondary | ICD-10-CM

## 2014-03-13 LAB — FETAL NONSTRESS TEST

## 2014-03-13 LAB — POCT URINALYSIS DIP (DEVICE)
Bilirubin Urine: NEGATIVE
Glucose, UA: NEGATIVE mg/dL
LEUKOCYTES UA: NEGATIVE
Nitrite: NEGATIVE
PH: 6 (ref 5.0–8.0)
PROTEIN: NEGATIVE mg/dL
Specific Gravity, Urine: 1.025 (ref 1.005–1.030)
UROBILINOGEN UA: 0.2 mg/dL (ref 0.0–1.0)

## 2014-03-13 NOTE — Progress Notes (Signed)
3 elevated fasting blood sugars.  2hr PP 5/21 elevated.  IOL in 3 days.  [place] today for position and growth. NST - reactive

## 2014-03-13 NOTE — Patient Instructions (Signed)
Induccin del trabajo de parto  (Labor Induction ) Se denomina induccin del trabajo de parto cuando se inician acciones para hacer que una mujer embarazada comience el trabajo de parto. La mayora de las mujeres comienzan el trabajo de parto sin ayuda entre las semanas 37 y 42 del embarazo. Cuando esto no ocurre o cuando hay una necesidad mdica, pueden utilizarse diferentes mtodos para inducirlo. La induccin del trabajo de parto hace que el tero se contraiga. Tambin hace que el cuello del tero se ablandemadure), se abra (se dilate), y se afine (se borre). Generalmente el trabajo de parto no se induce antes de las 39 semanas excepto que haya un problema con el beb o con la madre.  Antes de inducir el trabajo de parto, el mdico considerar cierto nmero de factores incluyendo los siguientes:  El estado del beb.  Cuntas semanas tiene de embarazo.  La madurez de los pulmones del beb.  El estado del cuello del tero.  La posicin del beb. CULES SON LOS MOTIVOS PARA INDUCIR UN PARTO? El trabajo de parto puede inducirse por las siguientes razones:  La salud del beb o de la madre estn en riesgo.  El embarazo se ha pasado de trmino en 1 semana o ms.  Ha roto la bolsa de aguas pero no se ha iniciado el trabajo de parto por s mismo.  La madre tiene algn trastorno de salud o una enfermedad grave, como hipertensin arterial, una infeccin, desprendimiento abrupto de la placenta o diabetes.  Hay escaso lquido amnitico alrededor del beb.  El beb presenta sufrimiento. La conveniencia o el deseo de que el beb nazca en una cierta fecha no es un motivo para inducir el parto. CULES SON LOS MTODOS UTILIZADOS PARA INDUCIR EL TRABAJO DE PARTO? Algunos mtodos de induccin del trabajo de parto son:   Administracin del medicamentos prostaglandina. Este medicamento hace que el cuello uterino se dilate y madure. Este medicamento tambin iniciar las contracciones. Puede tomarse por  boca o insertarse en la vagina en forma de supositorio.  Insercin en la vagina de un tubo delgado (catter) con un baln en el extremo para dilatar el cuello del tero. Una vez insertado, el baln se infla con agua, lo que provoca la apertura del cuello del tero.  Ruptura de las membranas. El mdico separa el saco amnitico del cuello uterino, haciendo que el cuello uterino se distienda y cause la liberacin de la hormona llamada progesterona. Esto hace que el tero se contraiga. Este procedimiento se realiza durante una visita al consultorio mdico. Le indicarn que vuelva a su casa y espere que se inicien las contracciones. Luego tendr que volver para la induccin.  Ruptura de la bolsa de aguas. El mdico romper el saco amnitico con un pequeo instrumento. Una vez que el saco amnitico se rompe, las contracciones deben comenzar. Pueden pasar algunas horas hasta que haga efecto.  Medicamentos que desencadenen o intensifiquen las contracciones. Se lo administrarn a travs de un catter por va intravenosa (IV) que se inserta en una de las venas del brazo. Todos los mtodos de induccin, excepto la ruptura de membranas, se realizan en el hospital. La induccin se realizar en el hospital, de modo que usted y el beb puedan ser controlados cuidadosamente.  CUNTO TIEMPO LLEVA INDUCIR EL TRABAJO DE PARTO? Algunas inducciones pueden demorar entre 2 y 3 das. Generalmente lleva menos tiempo, dependiendo del estado del cuello del tero. Puede tomar ms tiempo si la induccin se realiza en etapas tempranas del embarazo   o es su primer embarazo. Si han pasado 2 o 3 das y no se inicia el trabajo de parto, podrn enviarla a su casa o realizar una cesrea. CULES SON LOS RIESGOS ASOCIADOS CON LA INDUCCiN DEL TRABAJO DE PARTO? Algunos de los riesgos de la induccin son:   Cambios en la frecuencia cardaca fetal, por ejemplo los latidos son demasiado rpidos, o lentos, o errticos.  Riesgo de distrs  fetal.  Posibilidad de infeccin en la madre o el beb.  Aumento de la posibilidad de que sea necesaria una cesrea.  Ruptura (abrupcin) de la placenta del tero (raro).  Ruptura uterina (muy raro). Cuando es necesario realizar la induccin por razones mdicas, los beneficios deben superar a los riesgos. CULES SON ALGUNAS RAZONES PARA NO INDUCIR EL TRABAJO DE PARTO? La induccin no debe realizarse si:   Se demuestra que el beb no tolera el trabajo de parto.  Fue sometida anteriormente a cirugas en el tero, como una miomectoma o le han extirpado fibromas.  La placenta est en una posicin muy baja en el tero y obstruye la abertura del cuello (placenta previa).  El beb no est ubicado con la cabeza hacia bajo.  El cordn umbilical cae hacia el canal de parto, adelante del beb. Esto puede cortar el suministro de sangre y oxgeno al beb.  Fue sometida a una cesrea anteriormente.  Hay circunstancias poco habituales, como que el beb es extremadamente prematuro. Document Released: 06/03/2007 Document Revised: 10/27/2012 ExitCare Patient Information 2015 ExitCare, LLC. This information is not intended to replace advice given to you by your health care provider. Make sure you discuss any questions you have with your health care provider.  

## 2014-03-13 NOTE — Addendum Note (Signed)
Addended by: Jill Side on: 03/13/2014 09:20 AM   Modules accepted: Orders

## 2014-03-13 NOTE — Progress Notes (Signed)
Patient reports a lot of pain in pelvis and lower back from contractions, making it difficult to feel baby move as normal.  IOL on 1/7. Elane Fritz used for interpreter

## 2014-03-14 ENCOUNTER — Observation Stay (HOSPITAL_COMMUNITY)
Admission: AD | Admit: 2014-03-14 | Discharge: 2014-03-14 | Disposition: A | Discharge status: Home / Self Care | Payer: Self-pay | Source: Ambulatory Visit | Attending: Obstetrics & Gynecology | Admitting: Obstetrics & Gynecology

## 2014-03-14 ENCOUNTER — Encounter (HOSPITAL_COMMUNITY): Payer: Self-pay | Admitting: *Deleted

## 2014-03-14 ENCOUNTER — Telehealth (HOSPITAL_COMMUNITY): Payer: Self-pay | Admitting: *Deleted

## 2014-03-14 DIAGNOSIS — O321XX0 Maternal care for breech presentation, not applicable or unspecified: Principal | ICD-10-CM | POA: Diagnosis present

## 2014-03-14 DIAGNOSIS — Z3A38 38 weeks gestation of pregnancy: Secondary | ICD-10-CM | POA: Insufficient documentation

## 2014-03-14 MED ORDER — PRENATAL PLUS 27-1 MG PO TABS: 1.0000 | ORAL_TABLET | Freq: Every day | ORAL | Status: DC

## 2014-03-14 MED ORDER — ACETAMINOPHEN 325 MG PO TABS: 650.0000 mg | ORAL_TABLET | Freq: Four times a day (QID) | ORAL | Status: DC | PRN

## 2014-03-14 MED ORDER — GLYBURIDE 2.5 MG PO TABS
2.5000 mg | ORAL_TABLET | Freq: Every day | ORAL | Status: DC
  Filled 2014-03-14: qty 1

## 2014-03-14 NOTE — Telephone Encounter (Signed)
[phone] interpreter number

## 2014-03-14 NOTE — H&P (Signed)
LABOR ADMISSION HISTORY AND PHYSICAL  [name] [name] is a 39 y.o. female [phone] with IUP at [redacted]w[redacted]d by 12 wk [place] presenting for ECV due to breech. She reports +FMs, No LOF, no VB, no blurry vision, headaches or peripheral edema, and RUQ pain.  She plans on breast feeding. She request nexplanon vs IUD for birth control.  Dating: By 12 wk [place] --->  Estimated Date of Delivery: 03/23/14  Sono:    Breech on 1/4   Prenatal History/Complications: GDM on glyburide Polyhyrdamnios  Past Medical History: Past Medical History  Diagnosis Date  . Gestational diabetes   . GDM (gestational diabetes mellitus) 12/26/2013    Past Surgical History: Past Surgical History  Procedure Laterality Date  . No past surgeries      Obstetrical History: OB History    Gravida Para Term Preterm AB TAB SAB Ectopic Multiple Living   Social History: History   Social History  . Marital Status: Married    Spouse Name: N/A    Number of Children: N/A  . Years of Education: N/A   Social History Main Topics  . Smoking status: Never Smoker   . Smokeless tobacco: Never Used  . Alcohol Use: No  . Drug Use: No  . Sexual Activity: Yes   Other Topics Concern  . None   Social History Narrative    Family History: Family History  Problem Relation Age of Onset  . Diabetes Father     Allergies: No Known Allergies  Prescriptions prior to admission  Medication Sig Dispense Refill Last Dose  . acetaminophen (TYLENOL) 325 MG tablet Take 650 mg by mouth every 6 (six) hours as needed.   03/13/2014 at Unknown time  . glyBURIDE (DIABETA) 2.5 MG tablet Take 1 tablet in the morning, and 1.5 tablets at bedtime 60 tablet 3 03/13/2014 at Unknown time  . prenatal vitamin w/FE, FA (PRENATAL 1 + 1) 27-1 MG TABS tablet Take 1 tablet by mouth daily at 12 noon.   03/13/2014 at Unknown time     Review of Systems   All systems reviewed and negative except as stated in HPI  Blood pressure 112/56,  pulse 62, temperature 97.8 F (36.6 C), temperature source Oral, resp. rate 18. General appearance: alert, cooperative and appears stated age Lungs: clear to auscultation bilaterally Heart: regular rate and rhythm Abdomen: soft, non-tender; bowel sounds normal Pelvic: deferred Extremities: Homans sign is negative, no sign of DVT Presentation: cephalic on [place], confirmed x 2 by Dr. Su Hilt Fetal monitoringBaseline: 135 bpm, Variability: Good {> 6 bpm), Accelerations: Reactive and Decelerations: Absent Uterine activityNone Exam by:: Su Hilt   Prenatal labs: ABO, Rh: O/Positive/-- (06/08 0000) Antibody: Negative (06/08 0000) Rubella:   RPR: NON REAC (10/19 0951)  HBsAg: Negative (06/08 0000)  HIV: NONREACTIVE (10/19 0951)  GBS:   Neg 3 hr Glucola 112/198/148/117 Genetic screening  Quad neg Anatomy US WNL   Results for orders placed or performed in visit on 03/13/14 (from the past 24 hour(s))  Fetal nonstress test   Collection Time: 03/13/14  5:06 PM  Result Value Ref Range   NST Locations     NST Indications     NST Duration  minutes   NST Baseline     FHR Variabilities  Minimal (<5bpm), Moderate (6-25bpm), Marked (>25bpm)   Accelerations > 10bpm     Accelerations > 15 BPM     Decelerations  None, Variable,  Early, Variable/Early, Other (comment)   Uterine Activity     Acoustic Stimulation     Assessment  Reactive   NST Return     NST Read by      Patient Active Problem List   Diagnosis Date Noted  . Breech presentation 03/14/2014  . [redacted] weeks gestation of pregnancy   . [redacted] weeks gestation of pregnancy   . Maternal age 37+, multigravida, antepartum   . Transverse presentation of fetus, antepartum 02/20/2014  . Polyhydramnios 01/03/2014  . GDM (gestational diabetes mellitus) - medication controlled 12/26/2013  . Supervision of high risk pregnancy in second trimester 12/26/2013    Assessment: [name] [name] is a 39 y.o. [id number] at 4563w5d here for ECV for breech  presentation. Upon arrival, she was vertex on BSUS, confirmed x2. NST reactive. Has IOL scheduled on 03/16/2013 for A2gDM, asked to represent as scheduled. Will re-perform BSUS upon presentation given possibility of version again given polyhydramnios.  [name], Cheralyn Oliver C 03/14/2014, 9:01 AM

## 2014-03-14 NOTE — Telephone Encounter (Signed)
Preadmission screen  

## 2014-03-14 NOTE — Progress Notes (Signed)
Patient discharge instructions given and explained through interpreter [facility].  Educated about s/s of labor, ROM, decreased fetal movement, bleeding.  Also, inquired about gestational diabetes and understood to continue glyburide at this time.   Pt verbalized understanding and is aware of IOL on Thursday 03/16/13 at 0730.

## 2014-03-14 NOTE — Discharge Summary (Signed)
ANTENATAL DISCHARGE INSTRUCTIONS Date: 03/14/2014   Time: 9:09 AM   Pt admitted for ECV given breech presentation, but upon arrival to L&D was found to have spontaneously verted to cephalic, confirmed on BSUS x 2. Please see H&P on 1/5 for more detailed information.   A. MEDICATION PRESCRIBED FOR HOME:   . glyBURIDE  2.5 mg Oral Q breakfast  . prenatal vitamin w/FE, FA  1 tablet Oral Q1200    B. WOUND CARE: n/a C. DIET AT HOME: reg D. ACTIVITY: No restrictions E. SEXUAL ACTIVITY: No restrictions F. FOLLOW-UP CARE:  Return to: L&D on 1/7 for IOL   G. DISCHARGE TEACHING: Pt verbalized understanding of instructions H. MATERNAL DISCHARGE TO: Home

## 2014-03-16 ENCOUNTER — Inpatient Hospital Stay (HOSPITAL_COMMUNITY): Payer: Medicaid Other

## 2014-03-16 ENCOUNTER — Inpatient Hospital Stay (HOSPITAL_COMMUNITY)
Admission: RE | Admit: 2014-03-16 | Discharge: 2014-03-18 | DRG: 775 | Disposition: A | Discharge status: Home / Self Care | Payer: Medicaid Other | Source: Ambulatory Visit | Attending: Obstetrics and Gynecology | Admitting: Obstetrics and Gynecology

## 2014-03-16 ENCOUNTER — Encounter (HOSPITAL_COMMUNITY): Payer: Self-pay

## 2014-03-16 VITALS — BP 98/55 | HR 50 | Temp 98.0°F | Resp 18 | Ht 63.0 in | Wt 170.0 lb

## 2014-03-16 DIAGNOSIS — Z3483 Encounter for supervision of other normal pregnancy, third trimester: Secondary | ICD-10-CM | POA: Diagnosis present

## 2014-03-16 DIAGNOSIS — Z3A39 39 weeks gestation of pregnancy: Secondary | ICD-10-CM | POA: Diagnosis present

## 2014-03-16 DIAGNOSIS — IMO0001 Reserved for inherently not codable concepts without codable children: Secondary | ICD-10-CM

## 2014-03-16 DIAGNOSIS — O09523 Supervision of elderly multigravida, third trimester: Secondary | ICD-10-CM | POA: Diagnosis not present

## 2014-03-16 DIAGNOSIS — O321XX0 Maternal care for breech presentation, not applicable or unspecified: Secondary | ICD-10-CM | POA: Diagnosis present

## 2014-03-16 DIAGNOSIS — O3663X0 Maternal care for excessive fetal growth, third trimester, not applicable or unspecified: Secondary | ICD-10-CM | POA: Diagnosis present

## 2014-03-16 DIAGNOSIS — O322XX1 Maternal care for transverse and oblique lie, fetus 1: Secondary | ICD-10-CM

## 2014-03-16 DIAGNOSIS — O320XX0 Maternal care for unstable lie, not applicable or unspecified: Secondary | ICD-10-CM | POA: Diagnosis present

## 2014-03-16 DIAGNOSIS — O24419 Gestational diabetes mellitus in pregnancy, unspecified control: Secondary | ICD-10-CM

## 2014-03-16 LAB — CBC
HEMATOCRIT: 36.5 % (ref 36.0–46.0)
HEMOGLOBIN: 12.2 g/dL (ref 12.0–15.0)
MCH: 29.8 pg (ref 26.0–34.0)
MCHC: 33.4 g/dL (ref 30.0–36.0)
MCV: 89.2 fL (ref 78.0–100.0)
Platelets: 102 10*3/uL — ABNORMAL LOW (ref 150–400)
RBC: 4.09 MIL/uL (ref 3.87–5.11)
RDW: 13.7 % (ref 11.5–15.5)
WBC: 6.3 10*3/uL (ref 4.0–10.5)

## 2014-03-16 LAB — GLUCOSE, CAPILLARY
GLUCOSE-CAPILLARY: 110 mg/dL — AB (ref 70–99)
Glucose-Capillary: 65 mg/dL — ABNORMAL LOW (ref 70–99)
Glucose-Capillary: 84 mg/dL (ref 70–99)

## 2014-03-16 LAB — RPR

## 2014-03-16 LAB — TYPE AND SCREEN
ABO/RH(D): O POS
ANTIBODY SCREEN: NEGATIVE

## 2014-03-16 LAB — ABO/RH: ABO/RH(D): O POS

## 2014-03-16 MED ORDER — MISOPROSTOL 25 MCG QUARTER TABLET
50.0000 ug | ORAL_TABLET | ORAL | Status: DC | PRN
  Administered 2014-03-16 – 2014-03-17 (×3): 50 ug via ORAL
  Filled 2014-03-16: qty 0.5
  Filled 2014-03-16: qty 1
  Filled 2014-03-16 (×2): qty 0.5

## 2014-03-16 MED ORDER — OXYCODONE-ACETAMINOPHEN 5-325 MG PO TABS: 2.0000 | ORAL_TABLET | ORAL | Status: DC | PRN

## 2014-03-16 MED ORDER — LACTATED RINGERS IV SOLN
500.0000 mL | INTRAVENOUS | Status: DC | PRN
  Administered 2014-03-17: 500 mL via INTRAVENOUS

## 2014-03-16 MED ORDER — TERBUTALINE SULFATE 1 MG/ML IJ SOLN: 0.2500 mg | Freq: Once | INTRAMUSCULAR | Status: AC | PRN

## 2014-03-16 MED ORDER — OXYTOCIN 40 UNITS IN LACTATED RINGERS INFUSION - SIMPLE MED
62.5000 mL/h | INTRAVENOUS | Status: DC
Start: 2014-03-16 — End: 2014-03-17
  Filled 2014-03-16: qty 1000

## 2014-03-16 MED ORDER — CITRIC ACID-SODIUM CITRATE 334-500 MG/5ML PO SOLN: 30.0000 mL | ORAL | Status: DC | PRN

## 2014-03-16 MED ORDER — FLEET ENEMA 7-19 GM/118ML RE ENEM: 1.0000 | ENEMA | RECTAL | Status: DC | PRN

## 2014-03-16 MED ORDER — MISOPROSTOL 25 MCG QUARTER TABLET
25.0000 ug | ORAL_TABLET | ORAL | Status: DC | PRN
  Filled 2014-03-16: qty 0.25

## 2014-03-16 MED ORDER — TERBUTALINE SULFATE 1 MG/ML IJ SOLN
0.2500 mg | Freq: Once | INTRAMUSCULAR | Status: AC
  Administered 2014-03-16: 0.25 mg via SUBCUTANEOUS
  Filled 2014-03-16: qty 1

## 2014-03-16 MED ORDER — ACETAMINOPHEN 325 MG PO TABS: 650.0000 mg | ORAL_TABLET | ORAL | Status: DC | PRN

## 2014-03-16 MED ORDER — LACTATED RINGERS IV SOLN
INTRAVENOUS | Status: DC
  Administered 2014-03-17: 09:00:00 via INTRAVENOUS

## 2014-03-16 MED ORDER — ONDANSETRON HCL 4 MG/2ML IJ SOLN: 4.0000 mg | Freq: Four times a day (QID) | INTRAMUSCULAR | Status: DC | PRN

## 2014-03-16 MED ORDER — OXYCODONE-ACETAMINOPHEN 5-325 MG PO TABS: 1.0000 | ORAL_TABLET | ORAL | Status: DC | PRN

## 2014-03-16 MED ORDER — OXYTOCIN BOLUS FROM INFUSION
500.0000 mL | INTRAVENOUS | Status: DC
Start: 2014-03-16 — End: 2014-03-17

## 2014-03-16 MED ORDER — LIDOCAINE HCL (PF) 1 % IJ SOLN
30.0000 mL | INTRAMUSCULAR | Status: DC | PRN
  Filled 2014-03-16: qty 30

## 2014-03-16 NOTE — Progress Notes (Signed)
2nd external version completed--fetal presentation vertex--abdominal binder on

## 2014-03-16 NOTE — Progress Notes (Signed)
LABOR PROGRESS NOTE  [name] [name] is a 39 y.o. [phone] at 9441w0d  admitted for induction of labor due to Gestational diabetes.  Subjective: Doing well, no complaints.  Agreeable to external cephalic version  Objective: BP 117/70 mmHg  Pulse 68  Temp(Src) 97.9 F (36.6 C) (Oral)  Resp 20  Ht 5\' 3"  (1.6 m)  Wt 170 lb (77.111 kg)  BMI 30.12 kg/m2 or  Filed Vitals:   03/16/14 0750 03/16/14 0928  BP: 117/70   Pulse: 68   Temp: 97.9 F (36.6 C)   TempSrc: Oral   Resp: 20   Height:  5\' 3"  (1.6 m)  Weight:  170 lb (77.111 kg)      UC:   none SVE:   Dilation: 1 Effacement (%): Thick Station: Ballotable Exam by:: Dr. Loreta Aveacosta FHT 120 after ECV  Dilation: 1 Effacement (%): Thick Cervical Position: Middle Station: Ballotable Presentation: Vertex Exam by:: Dr. Loreta Aveacosta  Labs: Lab Results  Component Value Date   WBC 6.3 03/16/2014   HGB 12.2 03/16/2014   HCT 36.5 03/16/2014   MCV 89.2 03/16/2014   PLT 102* 03/16/2014    Assessment / Plan: IOL 2/2 GDM  Fetal Wellbeing:  Category I  Anticipated MOD:  NSVD  Breech: consented for external cephalic version, advised of risks of abruption, failure, fetal intolerance and need for cesarean section.  Premedicated with terbutaline, ECV was performed with ultrasound guidance without complication.  Dr. Jolayne Pantheronstant present for version in it's entirety.  Will monitor for 30 minutes, abdominal binder, start induction with PO cytotec 50mcg in 30minutes, will ultrasound to confirm still cephalic prior to medication.  [name] [name],[name] Bleiler ROCIO, MD 03/16/2014, 2:52 PM  Presentation verified prior to giving cytotec, at which time patient was again breech.  Discussed risks and benefits and ECV again performed under ultrasound guidance.  Tolerated well.  Cephalic after procedure, and FHT confirmed 120s.  After 30min NST again confirmed cephalic presentation with sono, 50mcg PO cytotec given now.  Abdominal binder still on  [name] [name],[name] Castilla ROCIO,  MD 4:03 PM

## 2014-03-16 NOTE — H&P (Signed)
HPI: [name] [name] is a 39 y.o. year old [id number] female at 3956w0d weeks gestation who presents to [facility] reporting Not in labor but for IOL 2/2 GDM. Patient is reporting some ligamental discomfort w/ contractions.   History    Clinic [facility] (transferred from [facility] at 27 weeks for GDM) Prenatal Labs  Dating 12 week ultrasound Blood type: O/Positive/-- (06/08 0000)  Genetic Screen Quad negative Antibody:Negative (06/08 0000)  Anatomic US ordered Rubella: Immune (06/08 0000)  GTT 3hr GTT: 112/198/148/117 RPR: Nonreactive (07/08 0000)   TDaP vaccine 12/26/13 HBsAg: Negative (06/08 0000)   Flu vaccine 12/26/13 HIV: Non-reactive (06/08 0000)   GBS neg GBS:   Contraception IUD (according to pt at bedside) Pap:  Baby Food Breast   Circumcision N/A - girl    [place]   Support Person Husband      OB History    Gravida Para Term Preterm AB TAB SAB Ectopic Multiple Living   5 3 3  1  1   3      Past Medical History  Diagnosis Date  . GDM (gestational diabetes mellitus) 12/26/2013  . Gestational diabetes     glyburide   Past Surgical History  Procedure Laterality Date  . No past surgeries     Family History: family history includes Diabetes in her father. Social History:  reports that she has never smoked. She has never used smokeless tobacco. She reports that she does not drink alcohol or use illicit drugs.   Prenatal Transfer Tool  Maternal Diabetes: Yes:  Diabetes Type:  Insulin/Medication controlled Genetic Screening: Normal Maternal Ultrasounds/Referrals: Abnormal:  Findings:   Other:at risk for LGA Fetal Ultrasounds or other Referrals:  None Maternal Substance Abuse:  No Significant Maternal Medications:  Meds include: Other: glyburide Significant Maternal Lab Results:  Lab values include: Group B Strep negative Other Comments:  None  ROS  Dilation: 1 Effacement (%): Thick Station: Ballotable Blood pressure 117/70, pulse 68, temperature 97.9  F (36.6 C), temperature source Oral, resp. rate 20, height 5\' 3"  (1.6 m), weight 77.111 kg (170 lb). Exam Physical Exam  Prenatal labs: ABO, Rh: --/--/O POS, O POS (01/07 0815) Antibody: NEG (01/07 0815) Rubella: Immune (06/08 0000) RPR: NON REAC (10/19 0951)  HBsAg: Negative (06/08 0000)  HIV: NONREACTIVE (10/19 0951)  GBS: Negative (12/21 0000)   Assessment: 1. Labor: IOL 2/2 GDM 2. Fetal Wellbeing: Category 1  3. Pain Control: IV 4. GBS: neg 5. 6556w0d week IUP 6. Breech presentation  Plan:  1. Admit to BS per consult with MD 2. Routine L&D orders 3. Analgesia/anesthesia PRN  4. Official US ordered    Kathee DeltonMcKeag, Ian D 03/16/2014, 1:10 PM

## 2014-03-16 NOTE — Progress Notes (Signed)
Patient ID: [name] [name], female   DOB: [birth year], 39 y.o.   MRN: [id number] Vtx presentation verified by BS US before placement of second cytotec dose.

## 2014-03-16 NOTE — Progress Notes (Signed)
External version started and completed--no complications--will wait 30 mins then proceed with induction per orders

## 2014-03-17 ENCOUNTER — Encounter (HOSPITAL_COMMUNITY): Payer: Self-pay

## 2014-03-17 ENCOUNTER — Inpatient Hospital Stay (HOSPITAL_COMMUNITY): Payer: Medicaid Other | Admitting: Anesthesiology

## 2014-03-17 DIAGNOSIS — O24429 Gestational diabetes mellitus in childbirth, unspecified control: Secondary | ICD-10-CM

## 2014-03-17 DIAGNOSIS — Z3A39 39 weeks gestation of pregnancy: Secondary | ICD-10-CM

## 2014-03-17 DIAGNOSIS — Z79899 Other long term (current) drug therapy: Secondary | ICD-10-CM

## 2014-03-17 LAB — CBC
HCT: 33.6 % — ABNORMAL LOW (ref 36.0–46.0)
HEMATOCRIT: 36.9 % (ref 36.0–46.0)
Hemoglobin: 11.4 g/dL — ABNORMAL LOW (ref 12.0–15.0)
Hemoglobin: 12.4 g/dL (ref 12.0–15.0)
MCH: 30.3 pg (ref 26.0–34.0)
MCH: 29.5 pg (ref 26.0–34.0)
MCHC: 33.9 g/dL (ref 30.0–36.0)
MCHC: 33.6 g/dL (ref 30.0–36.0)
MCV: 89.4 fL (ref 78.0–100.0)
MCV: 87.9 fL (ref 78.0–100.0)
PLATELETS: 101 10*3/uL — AB (ref 150–400)
PLATELETS: 100 10*3/uL — AB (ref 150–400)
RBC: 3.76 MIL/uL — ABNORMAL LOW (ref 3.87–5.11)
RBC: 4.2 MIL/uL (ref 3.87–5.11)
RDW: 13.8 % (ref 11.5–15.5)
RDW: 13.6 % (ref 11.5–15.5)
WBC: 7.7 10*3/uL (ref 4.0–10.5)
WBC: 11.5 10*3/uL — AB (ref 4.0–10.5)

## 2014-03-17 LAB — GLUCOSE, CAPILLARY
GLUCOSE-CAPILLARY: 68 mg/dL — AB (ref 70–99)
Glucose-Capillary: 87 mg/dL (ref 70–99)
Glucose-Capillary: 89 mg/dL (ref 70–99)

## 2014-03-17 MED ORDER — ONDANSETRON HCL 4 MG PO TABS: 4.0000 mg | ORAL_TABLET | ORAL | Status: DC | PRN

## 2014-03-17 MED ORDER — EPHEDRINE 5 MG/ML INJ
10.0000 mg | INTRAVENOUS | Status: DC | PRN
  Filled 2014-03-17: qty 2

## 2014-03-17 MED ORDER — LACTATED RINGERS IV SOLN
INTRAVENOUS | Status: DC
  Administered 2014-03-17: 16:00:00 via INTRAUTERINE

## 2014-03-17 MED ORDER — OXYTOCIN 40 UNITS IN LACTATED RINGERS INFUSION - SIMPLE MED
1.0000 m[IU]/min | INTRAVENOUS | Status: DC
  Administered 2014-03-17: 2 m[IU]/min via INTRAVENOUS

## 2014-03-17 MED ORDER — BENZOCAINE-MENTHOL 20-0.5 % EX AERO: 1.0000 "application " | INHALATION_SPRAY | CUTANEOUS | Status: DC | PRN

## 2014-03-17 MED ORDER — METHYLERGONOVINE MALEATE 0.2 MG/ML IJ SOLN: 0.2000 mg | Freq: Once | INTRAMUSCULAR | Status: DC

## 2014-03-17 MED ORDER — LANOLIN HYDROUS EX OINT: TOPICAL_OINTMENT | CUTANEOUS | Status: DC | PRN

## 2014-03-17 MED ORDER — DIPHENHYDRAMINE HCL 25 MG PO CAPS: 25.0000 mg | ORAL_CAPSULE | Freq: Four times a day (QID) | ORAL | Status: DC | PRN

## 2014-03-17 MED ORDER — SENNOSIDES-DOCUSATE SODIUM 8.6-50 MG PO TABS
2.0000 | ORAL_TABLET | ORAL | Status: DC
  Administered 2014-03-17: 2 via ORAL
  Filled 2014-03-17: qty 2

## 2014-03-17 MED ORDER — IBUPROFEN 600 MG PO TABS
600.0000 mg | ORAL_TABLET | Freq: Four times a day (QID) | ORAL | Status: DC | PRN
  Administered 2014-03-17: 600 mg via ORAL
  Filled 2014-03-17: qty 1

## 2014-03-17 MED ORDER — ONDANSETRON HCL 4 MG/2ML IJ SOLN: 4.0000 mg | INTRAMUSCULAR | Status: DC | PRN

## 2014-03-17 MED ORDER — WITCH HAZEL-GLYCERIN EX PADS: 1.0000 "application " | MEDICATED_PAD | CUTANEOUS | Status: DC | PRN

## 2014-03-17 MED ORDER — DIPHENHYDRAMINE HCL 50 MG/ML IJ SOLN: 12.5000 mg | INTRAMUSCULAR | Status: DC | PRN

## 2014-03-17 MED ORDER — ZOLPIDEM TARTRATE 5 MG PO TABS
5.0000 mg | ORAL_TABLET | Freq: Every evening | ORAL | Status: DC | PRN
  Administered 2014-03-17: 5 mg via ORAL
  Filled 2014-03-17: qty 1

## 2014-03-17 MED ORDER — IBUPROFEN 600 MG PO TABS
600.0000 mg | ORAL_TABLET | Freq: Four times a day (QID) | ORAL | Status: DC
  Administered 2014-03-17 – 2014-03-18 (×4): 600 mg via ORAL
  Filled 2014-03-17 (×4): qty 1

## 2014-03-17 MED ORDER — PHENYLEPHRINE 40 MCG/ML (10ML) SYRINGE FOR IV PUSH (FOR BLOOD PRESSURE SUPPORT)
80.0000 ug | PREFILLED_SYRINGE | INTRAVENOUS | Status: DC | PRN
  Administered 2014-03-17 (×2): 80 ug via INTRAVENOUS
  Filled 2014-03-17: qty 2

## 2014-03-17 MED ORDER — SIMETHICONE 80 MG PO CHEW: 80.0000 mg | CHEWABLE_TABLET | ORAL | Status: DC | PRN

## 2014-03-17 MED ORDER — LIDOCAINE HCL (PF) 1 % IJ SOLN
INTRAMUSCULAR | Status: DC | PRN
  Administered 2014-03-17 (×2): 4 mL

## 2014-03-17 MED ORDER — PRENATAL MULTIVITAMIN CH
1.0000 | ORAL_TABLET | Freq: Every day | ORAL | Status: DC
  Administered 2014-03-18: 1 via ORAL
  Filled 2014-03-17: qty 1

## 2014-03-17 MED ORDER — DIBUCAINE 1 % RE OINT: 1.0000 "application " | TOPICAL_OINTMENT | RECTAL | Status: DC | PRN

## 2014-03-17 MED ORDER — FENTANYL 2.5 MCG/ML BUPIVACAINE 1/10 % EPIDURAL INFUSION (WH - ANES)
14.0000 mL/h | INTRAMUSCULAR | Status: DC | PRN
Start: 2014-03-17 — End: 2014-03-17

## 2014-03-17 MED ORDER — OXYTOCIN 40 UNITS IN LACTATED RINGERS INFUSION - SIMPLE MED: 62.5000 mL/h | INTRAVENOUS | Status: DC | PRN

## 2014-03-17 MED ORDER — LACTATED RINGERS IV SOLN
500.0000 mL | Freq: Once | INTRAVENOUS | Status: AC
  Administered 2014-03-17: 500 mL via INTRAVENOUS

## 2014-03-17 MED ORDER — FENTANYL 2.5 MCG/ML BUPIVACAINE 1/10 % EPIDURAL INFUSION (WH - ANES)
INTRAMUSCULAR | Status: DC | PRN
  Administered 2014-03-17: 14 mL/h via EPIDURAL

## 2014-03-17 MED ORDER — TETANUS-DIPHTH-ACELL PERTUSSIS 5-2.5-18.5 LF-MCG/0.5 IM SUSP: 0.5000 mL | Freq: Once | INTRAMUSCULAR | Status: DC

## 2014-03-17 MED ORDER — FENTANYL CITRATE 0.05 MG/ML IJ SOLN
100.0000 ug | INTRAMUSCULAR | Status: DC | PRN
  Administered 2014-03-17: 100 ug via INTRAVENOUS
  Filled 2014-03-17: qty 2

## 2014-03-17 MED ORDER — PHENYLEPHRINE 40 MCG/ML (10ML) SYRINGE FOR IV PUSH (FOR BLOOD PRESSURE SUPPORT)
80.0000 ug | PREFILLED_SYRINGE | INTRAVENOUS | Status: DC | PRN
  Filled 2014-03-17: qty 2

## 2014-03-17 MED ORDER — METHYLERGONOVINE MALEATE 0.2 MG/ML IJ SOLN
INTRAMUSCULAR | Status: AC
  Administered 2014-03-17: 0.2 mg
  Filled 2014-03-17: qty 1

## 2014-03-17 MED ORDER — FENTANYL 2.5 MCG/ML BUPIVACAINE 1/10 % EPIDURAL INFUSION (WH - ANES)
INTRAMUSCULAR | Status: DC
Start: 2014-03-17 — End: 2014-03-17
  Filled 2014-03-17: qty 125

## 2014-03-17 MED ORDER — PHENYLEPHRINE 40 MCG/ML (10ML) SYRINGE FOR IV PUSH (FOR BLOOD PRESSURE SUPPORT)
PREFILLED_SYRINGE | INTRAVENOUS | Status: AC
Start: 2014-03-17 — End: 2014-03-18
  Filled 2014-03-17: qty 20

## 2014-03-17 NOTE — Progress Notes (Signed)
   [name] [name] is a 39 y.o. [phone] at 5242w1d  admitted for induction of labor due to Gestational diabetes.  Subjective: Feeling contractions more now  Objective: Filed Vitals:   03/16/14 1431 03/16/14 1855 03/16/14 1949 03/17/14 0019  BP: 111/81 109/52 121/61 114/66  Pulse: 67 70 70 66  Temp: 98.2 F (36.8 [name]) 98.5 F (36.9 [name]) 98.5 F (36.9 [name]) 98 F (36.7 [name])  TempSrc: Oral Oral Oral Oral  Resp: 18 16 18 18   Height:      Weight:          FHT:  FHR: 135 bpm, variability: moderate,  accelerations:  Present,  decelerations:  Absent UC:   irregular, every 1-3 minutes SVE:   Dilation: 1.5 Effacement (%): 50 Station: Ballotable Exam by:: [name] [name]., cnm  Labs: Lab Results  Component Value Date   WBC 6.3 03/16/2014   HGB 12.2 03/16/2014   HCT 36.5 03/16/2014   MCV 89.2 03/16/2014   PLT 102* 03/16/2014    Assessment / Plan: IOL for GDM, ripening phase.  Unstable lie.  Will not proceed with foley d/t increased risk for malpositioning.  One more cytotec, then will start pitocin  Labor: Progressing normally Fetal Wellbeing:  Category I Pain Control:  Labor support without medications Anticipated MOD:  NSVD  [name] [name] 03/17/2014, 2:17 AM

## 2014-03-17 NOTE — Progress Notes (Signed)
Epidural removed intact (blue tip visualized). Bandaid applied to site. Patient tolerated well.

## 2014-03-17 NOTE — Progress Notes (Signed)
LABOR PROGRESS NOTE  [name] MoatsLucila F [name] is a 39 y.o. [phone] at 2360w1d  admitted for induction of labor due to gDM.  Subjective: Very uncomfortable, s/p epidural and SROM.  Although pain is very bad now, has improved with epidural as was previously "killing me"  Objective: BP 128/67 mmHg  Pulse 65  Temp(Src) 98.7 F (37.1 C) (Oral)  Resp 18  Ht 5\' 3"  (1.6 m)  Wt 170 lb (77.111 kg)  BMI 30.12 kg/m2  SpO2 99% or  Filed Vitals:   03/17/14 1458 03/17/14 1503 03/17/14 1508 03/17/14 1512  BP: 113/71 119/72 117/74 128/67  Pulse: 74 65 77 65  Temp:      TempSrc:      Resp: 20 18 18 18   Height:      Weight:      SpO2:  99%  99%       FHT:  FHR: 145 bpm, variability: moderate,  accelerations:  Abscent,  decelerations:  Present deep variables with contractions UC:   q693min SVE:   Dilation: 5 Effacement (%): 70 Station: -2 Exam by:: Dr.Erhardt Dada  Dilation: 5 Effacement (%): 70 Cervical Position: Middle Station: -2 Presentation: Vertex Exam by:: Dr.Anuradha Chabot Cephalic presentation confirmed with sono  Pitocin @ 10 mu/min  Labs: Lab Results  Component Value Date   WBC 7.7 03/17/2014   HGB 11.4* 03/17/2014   HCT 33.6* 03/17/2014   MCV 89.4 03/17/2014   PLT 101* 03/17/2014    Assessment / Plan: IOL 2/2 DM  Labor: Progressing normally Fetal Wellbeing:  Category II, IUPC placed for amnioinfusion, head still ballotable abdominal binder replaced given hx of very unstable lie Pain Control:  Epidural Anticipated MOD:  NSVD  Ahria Slappey ROCIO, MD 03/17/2014, 3:31 PM

## 2014-03-17 NOTE — Anesthesia Preprocedure Evaluation (Signed)
Anesthesia Evaluation  Patient identified by MRN, date of birth, ID band Patient awake    Reviewed: Allergy & Precautions, NPO status , Patient's Chart, lab work & pertinent test results  History of Anesthesia Complications Negative for: history of anesthetic complications  Airway Mallampati: III  TM Distance: >3 FB Neck ROM: Full    Dental no notable dental hx. (+) Dental Advisory Given   Pulmonary neg pulmonary ROS,  breath sounds clear to auscultation  Pulmonary exam normal       Cardiovascular negative cardio ROS  Rhythm:Regular Rate:Normal     Neuro/Psych negative neurological ROS  negative psych ROS   GI/Hepatic negative GI ROS, Neg liver ROS,   Endo/Other  diabetes, Gestational  Renal/GU negative Renal ROS  negative genitourinary   Musculoskeletal negative musculoskeletal ROS (+)   Abdominal   Peds negative pediatric ROS (+)  Hematology negative hematology ROS (+)   Anesthesia Other Findings   Reproductive/Obstetrics (+) Pregnancy                             Anesthesia Physical Anesthesia Plan  ASA: II  Anesthesia Plan: Epidural   Post-op Pain Management:    Induction:   Airway Management Planned:   Additional Equipment:   Intra-op Plan:   Post-operative Plan:   Informed Consent: I have reviewed the patients History and Physical, chart, labs and discussed the procedure including the risks, benefits and alternatives for the proposed anesthesia with the patient or authorized representative who has indicated his/her understanding and acceptance.     Plan Discussed with: CRNA  Anesthesia Plan Comments: (Discussed plan for epidural with patient via an interpreter. Discussed her medical history as well and patient denies any bleeding history or family history of bleeding disorders. Discussed risks associated with epidurals and thrombocytopenia. Plt count stable at 101.  Decision to move forward with epidural placement and plan to check platelet count prior to removal of epidural catheter.)        Anesthesia Quick Evaluation

## 2014-03-17 NOTE — Progress Notes (Signed)
Interpretor at bedside to explain epidural procedure

## 2014-03-17 NOTE — Anesthesia Procedure Notes (Addendum)
Epidural Patient location during procedure: OB Start time: 03/17/2014 2:20 PM End time: 03/17/2014 2:25 PM  Staffing Anesthesiologist: Felipe DroneJUDD, Tamani Durney JENNETTE Performed by: anesthesiologist   Preanesthetic Checklist Completed: patient identified, site marked, surgical consent, pre-op evaluation, timeout performed, IV checked, risks and benefits discussed and monitors and equipment checked  Epidural Patient position: sitting Prep: site prepped and draped and DuraPrep Patient monitoring: continuous pulse ox and blood pressure Approach: midline Location: L3-L4 Injection technique: LOR saline  Needle:  Needle type: Tuohy  Needle gauge: 17 G Needle length: 9 cm and 9 Needle insertion depth: 4 cm Catheter type: closed end flexible Catheter size: 19 Gauge Catheter at skin depth: 9 cm Test dose: negative  Assessment Events: blood not aspirated, injection not painful, no injection resistance, negative IV test and no paresthesia  Additional Notes Patient identified. Risks/Benefits/Options discussed with patient including but not limited to bleeding, infection, nerve damage, paralysis, failed block, incomplete pain control, headache, blood pressure changes, nausea, vomiting, reactions to medication both or allergic, itching and postpartum back pain. Confirmed with bedside nurse the patient's most recent platelet count. Confirmed with patient that they are not currently taking any anticoagulation, have any bleeding history or any family history of bleeding disorders. Patient expressed understanding and wished to proceed. All questions were answered. Sterile technique was used throughout the entire procedure. Please see nursing notes for vital signs. Test dose was given through epidural catheter and negative prior to continuing to dose epidural or start infusion. Warning signs of high block given to the patient including shortness of breath, tingling/numbness in hands, complete motor block, or any  concerning symptoms with instructions to call for help. Patient was given instructions on fall risk and not to get out of bed. All questions and concerns addressed with instructions to call with any issues or inadequate analgesia.

## 2014-03-17 NOTE — Progress Notes (Signed)
02/16/14 NST reactive

## 2014-03-17 NOTE — Progress Notes (Signed)
I stopped by patients room to check on her needs, by Orlan LeavensViria Alvarez, Spanish Interpreter

## 2014-03-18 LAB — GLUCOSE, CAPILLARY: Glucose-Capillary: 80 mg/dL (ref 70–99)

## 2014-03-18 MED ORDER — OXYCODONE-ACETAMINOPHEN 5-325 MG PO TABS
1.0000 | ORAL_TABLET | ORAL | Status: DC | PRN
  Administered 2014-03-18: 1 via ORAL
  Filled 2014-03-18: qty 1

## 2014-03-18 NOTE — Discharge Summary (Signed)
Obstetric Discharge Summary Reason for Admission: induction of labor 2/2 A2/BDM Prenatal Procedures: NST Intrapartum Procedures: spontaneous vaginal delivery Postpartum Procedures: none Complications-Operative and Postpartum: none  Patient is 39 y.o. [id number] admitted A2/BDM, s/p ECV x 2 prior to induction.   Delivery Note At 4:01 PM a viable female was delivered via Vaginal, Spontaneous Delivery (Presentation: Left Occiput Anterior).  APGAR: 7, 9; weight 7 lb 15.9 oz (3626 g).   Placenta status: Intact, Spontaneous.  Cord: 3 vessels with the following complications: None.   Anesthesia: Epidural  Episiotomy: None Lacerations: None Suture Repair: n/a Est. Blood Loss (mL): 400  Mom to postpartum.  Baby to Couplet care / Skin to Skin.  [name] [name] 03/17/2014, 4:44 PM   Hospital Course:  Active Problems:   Active labor   [name] [name] is a 39 y.o. [id number] s/p SVD 2/2 IOL 2/2 A2/BDM on glyburide well controlled. Presented twice for ECV but was cephalic each time, when presented for IOL was breech, ECV successful, prior to cytotec admin pt was again breech, 2nd ECV performed successfully and abdominal binder placed, cephalic presentation confirmed with sono multiple times. She has postpartum course that was uncomplicated including no problems with ambulating, PO intake, urination, pain, or bleeding. The pt feels ready to go home and  will be discharged with outpatient follow-up.   Today: No acute events overnight.  Pt denies problems with ambulating, voiding or po intake.  She denies nausea or vomiting.  Pain is moderately controlled.   Lochia Small.  Plan for birth control is  IUD vs vasectomy.  Method of Feeding: breast   H/H: Lab Results  Component Value Date/Time   HGB 12.4 03/17/2014 06:17 PM   HGB 11.8 08/15/2013   HCT 36.9 03/17/2014 06:17 PM   HCT 36 08/15/2013    Discharge Diagnoses: Term Pregnancy-delivered  Discharge Information: Date:  03/18/2014 Activity: pelvic rest Diet: routine  Medications: None Breast feeding:  Yes and supplementing with bottle, LC to see today Condition: stable Instructions: refer to handout Discharge to: home F/u 6 weeks for 2h gtt  Discharge Instructions    Call MD for:  redness, tenderness, or signs of infection (pain, swelling, redness, odor or green/yellow discharge around incision site)    Complete by:  As directed      Call MD for:  severe uncontrolled pain    Complete by:  As directed      Call MD for:  temperature >100.4    Complete by:  As directed      Diet - low sodium heart healthy    Complete by:  As directed             Medication List    STOP taking these medications        glyBURIDE 2.5 MG tablet  Commonly known as:  DIABETA      TAKE these medications        prenatal multivitamin Tabs tablet  Take 1 tablet by mouth daily at 12 noon.      ASK your doctor about these medications        acetaminophen 325 MG tablet  Commonly known as:  TYLENOL  Take 650 mg by mouth every 6 (six) hours as needed.           Follow-up Information    Follow up with [facility] In 6 weeks.   Contact information:   [address] [place] [id number] [phone]       Perry MountACOSTA,Safaa Stingley [name] ,  MD OB Fellow 03/18/2014,12:23 PM

## 2014-03-18 NOTE — Lactation Note (Signed)
This note was copied from the chart of Girl Megan MansLucila Queenan. Lactation Consultation Note  Patient Name: Girl Megan MansLucila Ragain WUJWJ'XToday's Date: 03/18/2014 Reason for consult: Initial assessment In house Spanish Interpreter present for visit. Mom experienced BF, denies questions or concerns. Mom is breast/bottle feeding. Reports baby is BF well. Mom has lactation brochure. Advised of OP services and support group. Encouraged to call for questions/concerns.   Maternal Data Does the patient have breastfeeding experience prior to this delivery?: Yes  Feeding    LATCH Score/Interventions Latch: Grasps breast easily, tongue down, lips flanged, rhythmical sucking.  Audible Swallowing: Spontaneous and intermittent  Type of Nipple: Everted at rest and after stimulation  Comfort (Breast/Nipple): Soft / non-tender     Hold (Positioning): Assistance needed to correctly position infant at breast and maintain latch.  LATCH Score: 9  Lactation Tools Discussed/Used     Consult Status Consult Status: Complete    Alfred LevinsGranger, Marquail Bradwell Ann 03/18/2014, 5:08 PM

## 2014-03-18 NOTE — Progress Notes (Addendum)
At soon I arrived to the room to check on patients needs,Patient states RN Jon Gillslexis came to the room early and started taking with her and her husband without a Interpreter, inmediately after that I went and look for the RN and explain to her the patients concerned, we was able to clarify the information with me after that. I assisted Psychologist, sport and exerciseAlexis RN with feeding upgrades and diaper change, by Orlan LeavensViria Alvarez Spanish Interpreter

## 2014-03-18 NOTE — Discharge Instructions (Signed)

## 2014-03-18 NOTE — Anesthesia Postprocedure Evaluation (Signed)
Anesthesia Post Note  Patient: [name] MoatsLucila F Googe  Procedure(s) Performed: * No procedures listed *  Anesthesia type: Epidural  Patient location: Mother/Baby  Post pain: Pain level controlled  Post assessment: Post-op Vital signs reviewed  Last Vitals:  Filed Vitals:   03/18/14 0500  BP: 98/55  Pulse: 50  Temp:   Resp: 18    Post vital signs: Reviewed  Level of consciousness: awake  Complications: No apparent anesthesia complications

## 2014-03-19 NOTE — Progress Notes (Signed)
Checked on patients needs and ordered patient dinner.  Spanish Interpreter - [name] GlassmanBenita [name]

## 2014-03-19 NOTE — Progress Notes (Signed)
Assisted RN with interpretation of discharge instructions  °Spanish Interpreter - Benita Sanchez °

## 2014-03-20 NOTE — Progress Notes (Signed)
Ur chart review completed.  

## 2014-04-11 ENCOUNTER — Encounter: Payer: Self-pay | Admitting: Obstetrics & Gynecology

## 2014-04-28 ENCOUNTER — Ambulatory Visit (INDEPENDENT_AMBULATORY_CARE_PROVIDER_SITE_OTHER): Payer: Self-pay | Admitting: Family Medicine

## 2014-04-28 ENCOUNTER — Encounter: Payer: Self-pay | Admitting: Family Medicine

## 2014-04-28 NOTE — Progress Notes (Signed)
Here for postpartum visit. Wants to talk to doctor about medicines to take for migraine headaches. Used Interpreter Marlon PelSylvia Sobalvarro.

## 2014-04-28 NOTE — Progress Notes (Signed)
  Subjective:     [name] [name] is a 39 y.o. female who presents for a postpartum visit. She is 5 weeks postpartum following a spontaneous vaginal delivery. I have fully reviewed the prenatal and intrapartum course. The delivery was at 39 gestational weeks. Outcome: spontaneous vaginal delivery. Anesthesia: none. Postpartum course has been normal. Baby's course has been normal. Baby is feeding by breast. Bleeding no bleeding. Bowel function is normal. Bladder function is normal. Patient is not sexually active. Contraception method is IUD. Postpartum depression screening: negative.  The following portions of the patient's history were reviewed and updated as appropriate: allergies, current medications, past family history, past medical history, past social history, past surgical history and problem list.  Review of Systems Pertinent items are noted in HPI.   Objective:    BP 106/68 mmHg  Pulse 75  Temp(Src) 98.3 F (36.8 C)  Wt 150 lb 3.2 oz (68.13 kg)  Breastfeeding? Yes  General:  alert, cooperative and no distress  Lungs: clear to auscultation bilaterally  Heart:  regular rate and rhythm, S1, S2 normal, no murmur, click, rub or gallop  Abdomen: soft, non-tender; bowel sounds normal; no masses,  no organomegaly   Vulva:  normal        Assessment:     normal postpartum exam. Pap smear not done at today's visit.   Plan:    1. Contraception: IUD - patient directed to HD as does not have insurance. 2. Follow up in: 1 year or as needed.

## 2014-05-02 ENCOUNTER — Other Ambulatory Visit: Payer: Self-pay

## 2014-05-11 ENCOUNTER — Encounter: Payer: Self-pay | Admitting: Obstetrics & Gynecology

## 2015-10-17 IMAGING — US US FETAL BPP W/O NONSTRESS
1 series · 13 of 20 positions shown · non-contrast
Comparison: none

[Series 1: us fetal bpp w/o nonstress · non-contrast · 20 acquisitions, 13 frames shown]
[im 1/20]
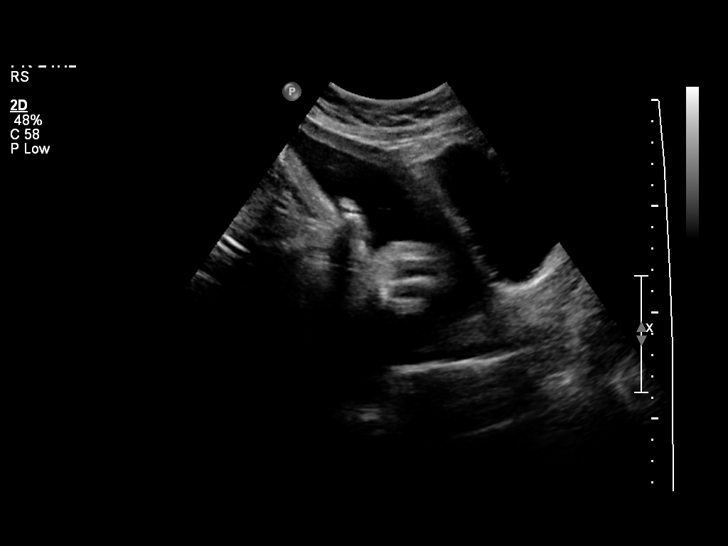
[im 3/20]
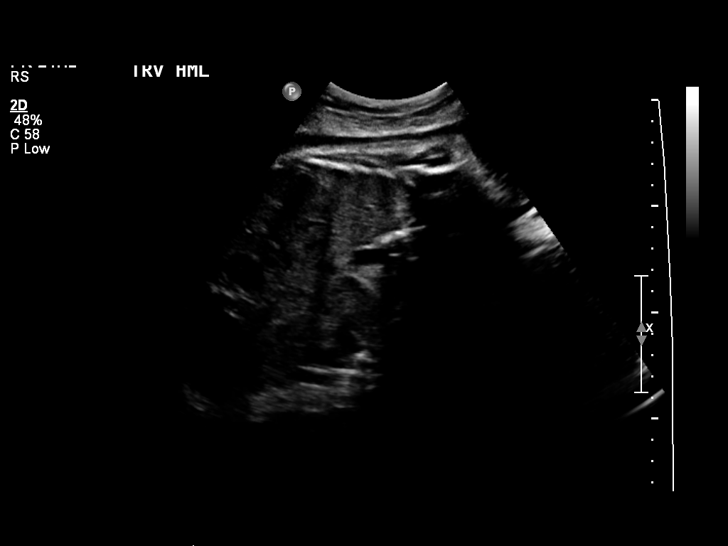
[im 4/20]
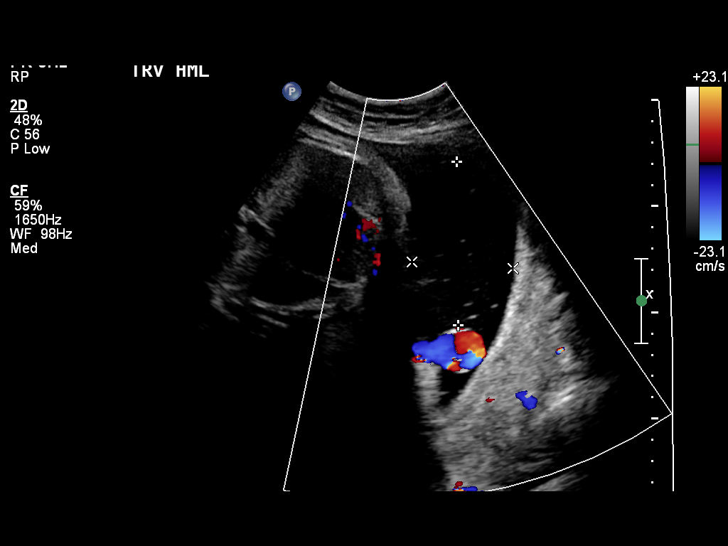
[im 6/20]
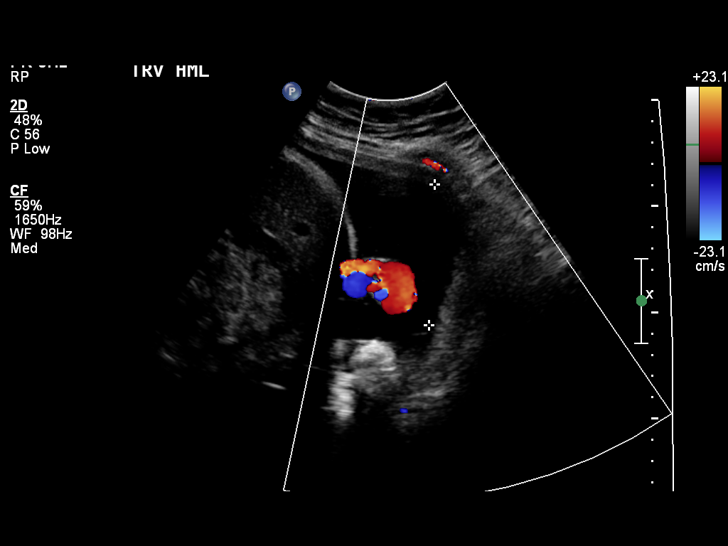
[im 7/20]
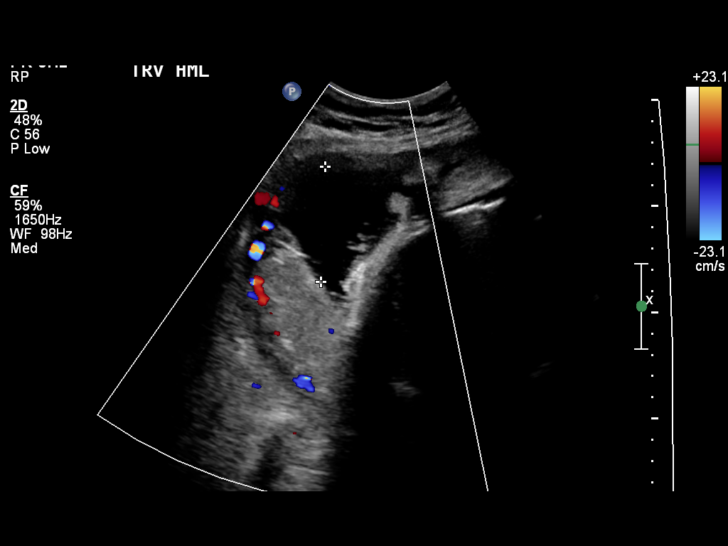
[im 9/20]
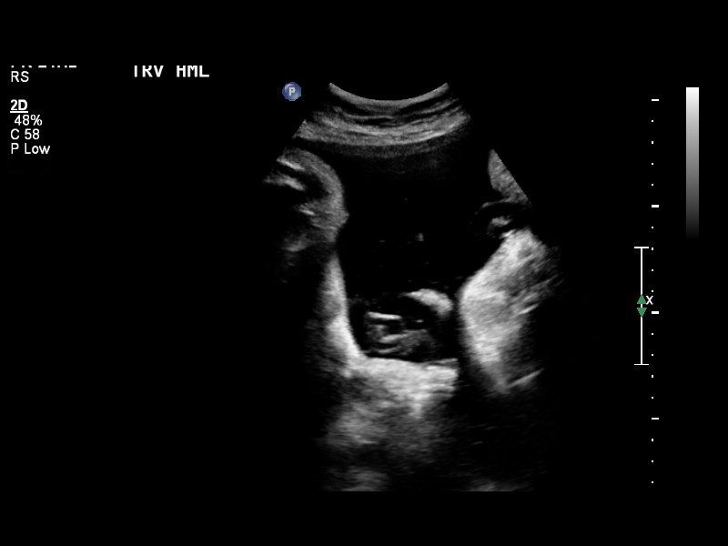
[im 11/20]
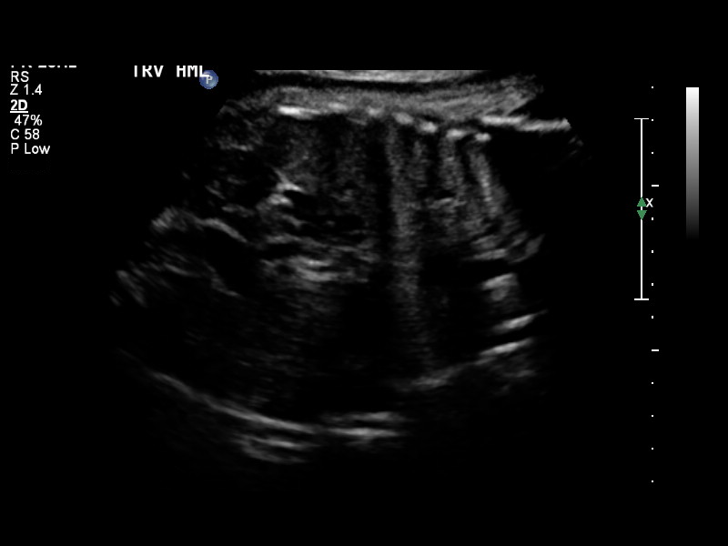
[im 12/20]
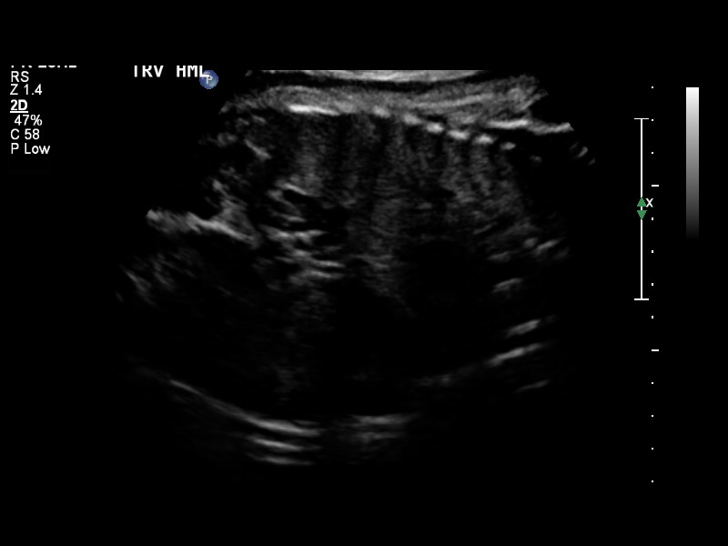
[im 14/20]
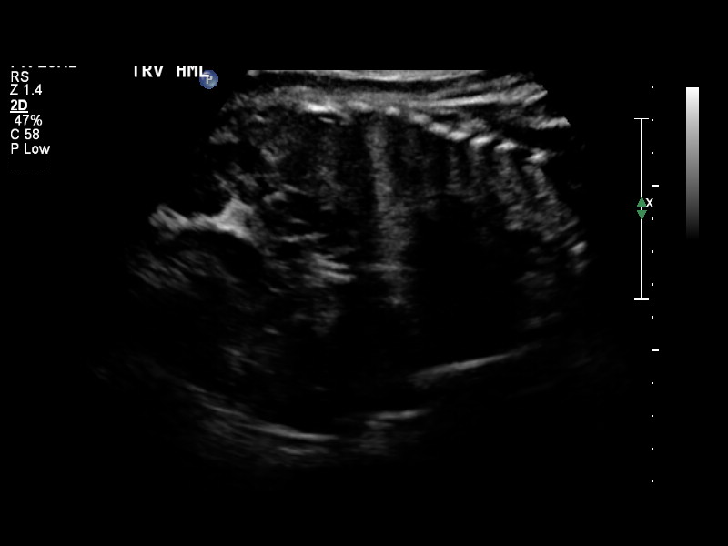
[im 15/20]
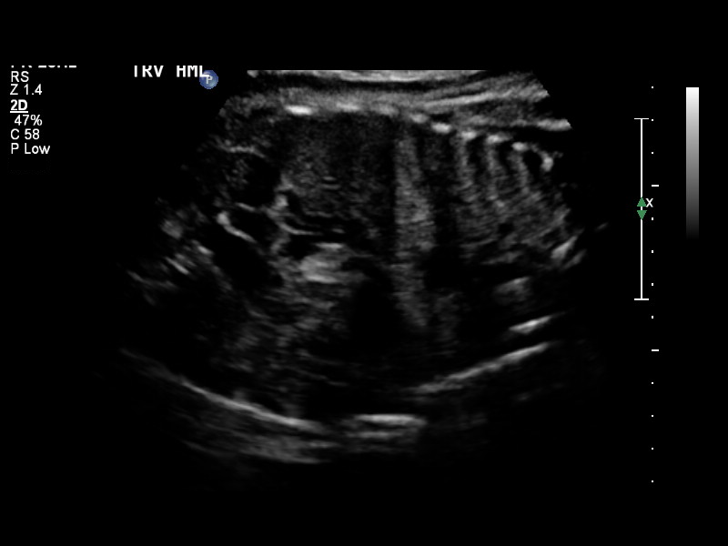
[im 17/20]
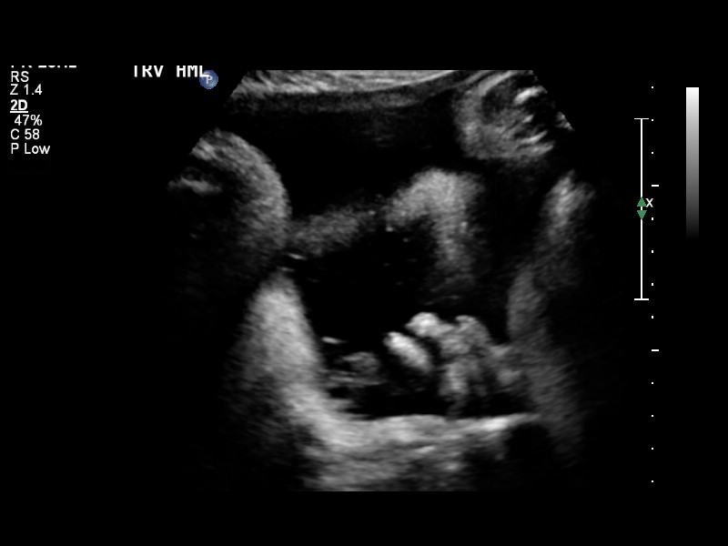
[im 18/20]
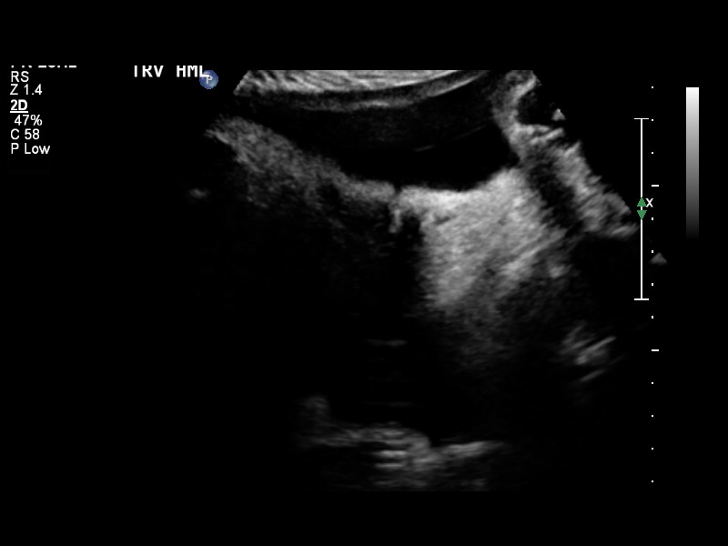
[im 20/20]
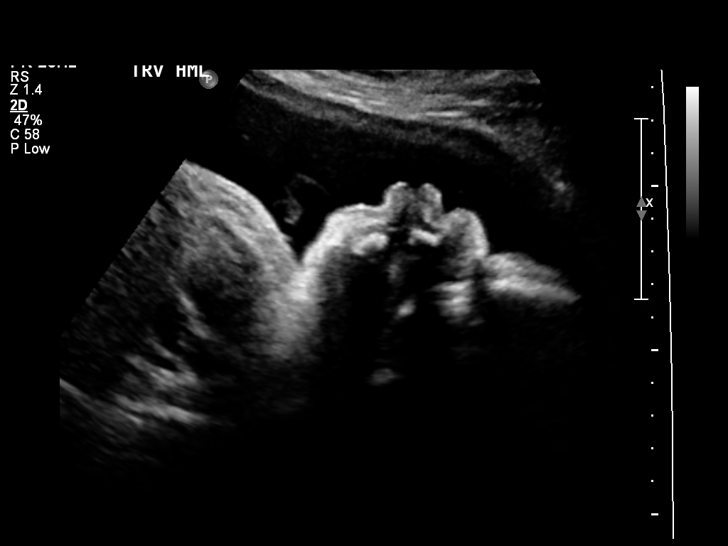

[13 of 20 positions shown; findings below may reference images not displayed]

OBSTETRICS REPORT
                      (Signed Final 02/28/2014 [DATE])

Service(s) Provided

Indications

 Gestational diabetes in pregnancy (glyburide)
 Advanced maternal age multigravida (35), third
 trimester
 Polyhydramnios
 36 weeks gestation of pregnancy
Fetal Evaluation

 Num Of Fetuses:    1
 Fetal Heart Rate:  153                          bpm
 Cardiac Activity:  Observed
 Presentation:      Transverse, head to
                    maternal left

 Comment:    [DATE] BPP in 5 mins.

 Amniotic Fluid
 AFI FV:      Subjectively upper-normal
 AFI Sum:     24.61    cm      95  %Tile      Larg Pckt:   7.63  cm
 RUQ:   7.63    cm   RLQ:    6.6    cm    LUQ:    4.98   cm    LLQ:   5.4     cm
Biophysical Evaluation

 Amniotic F.V:   Within normal limits       F. Tone:         Observed
 F. Movement:    Observed                   Score:           [DATE]
 F. Breathing:   Observed
Gestational Age

 Best:          36w 4d     Det. By:  Early Ultrasound         EDD:    03/23/14
                                     (09/07/13)
Impression

 SIUP at 36+4 weeks
 High normal amniotic fluid volume
 BPP [DATE]
Recommendations

 Continue twice weekly NSTs with weekly AFIs or weekly
 BPPs (if AFV normal next week, dc testing)

## 2015-10-31 IMAGING — US US OB FOLLOW-UP
1 series · 12 of 28 positions shown · non-contrast
Comparison: none

[Series 1: us ob follow up · 51 acquisitions, 12 frames shown]
[im 2/51]
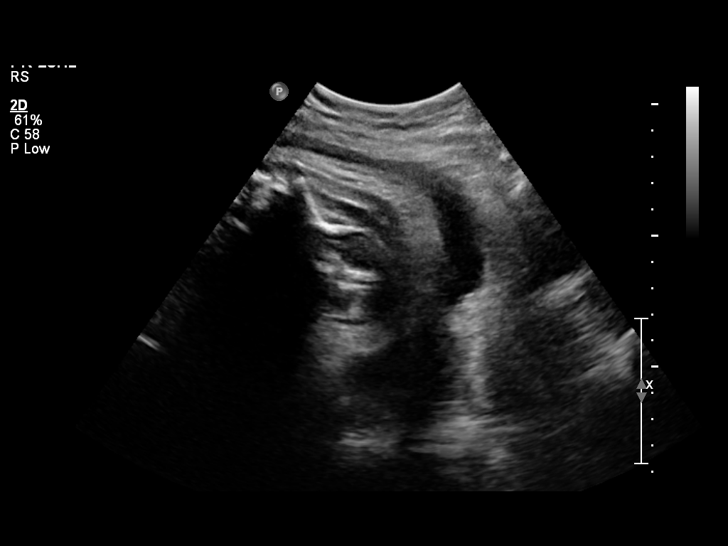
[im 6/51]
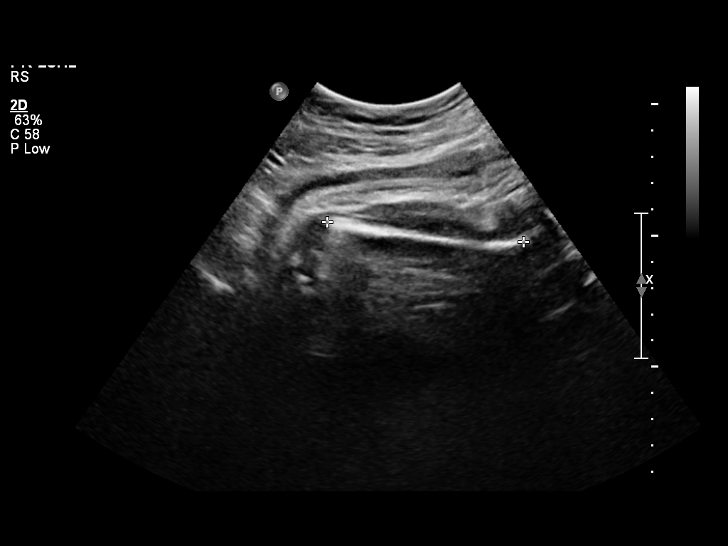
[im 10/51]
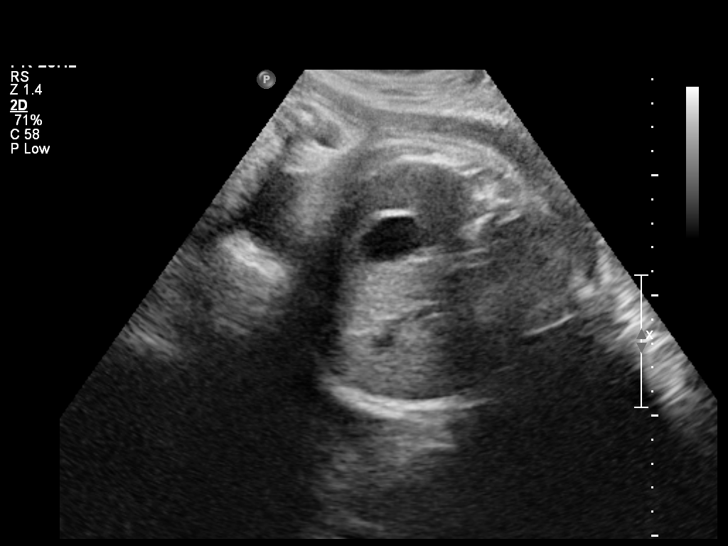
[im 15/51]
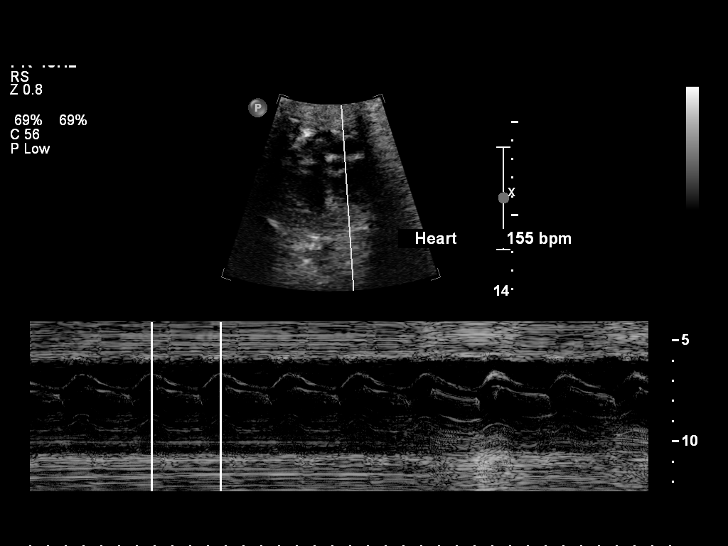
[im 19/51]
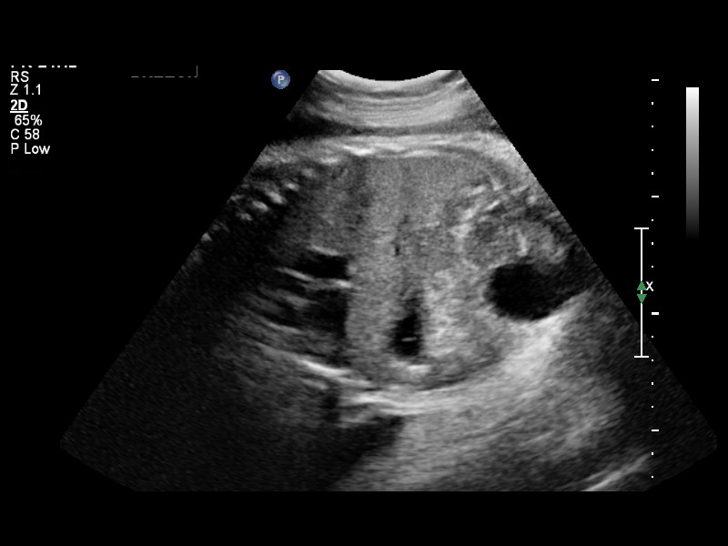
[im 23/51]
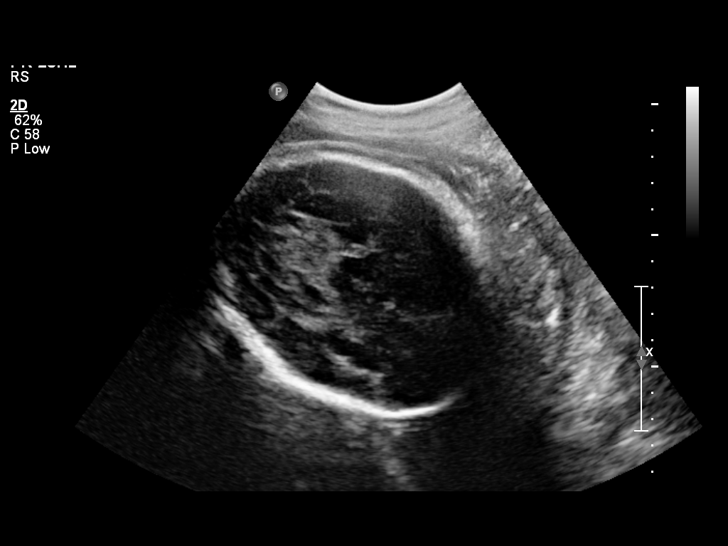
[im 28/51]
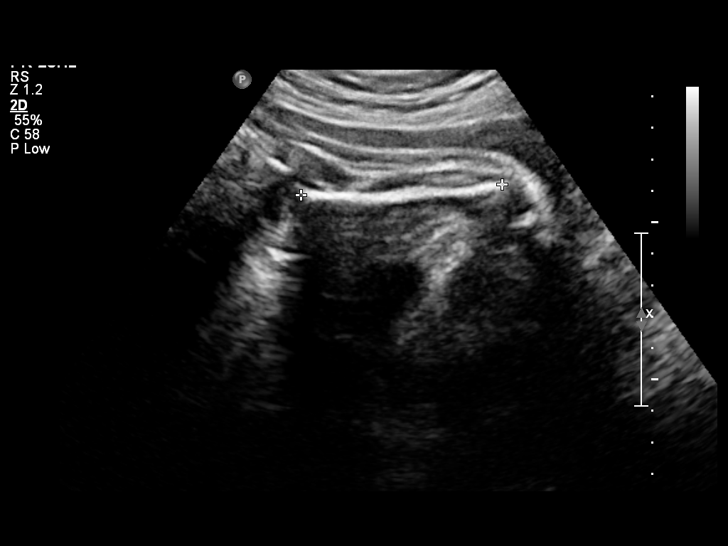
[im 32/51]
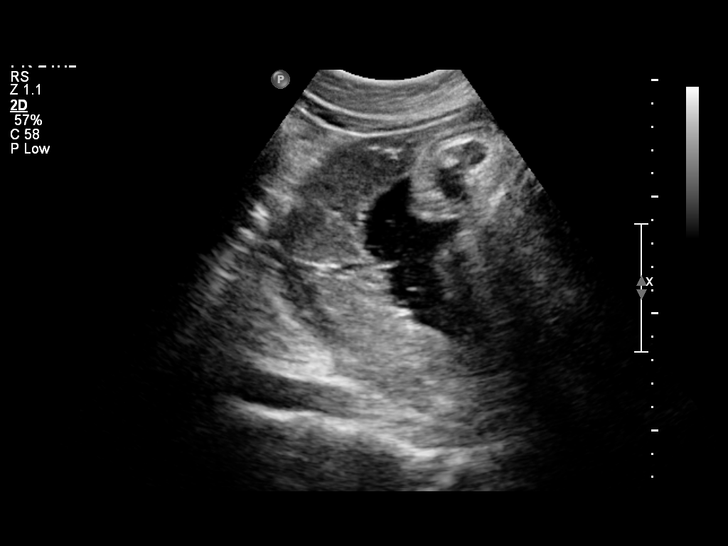
[im 36/51]
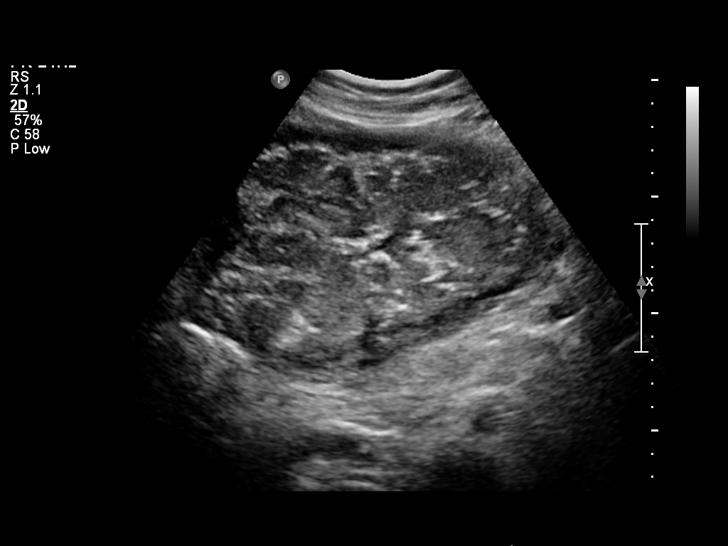
[im 41/51]
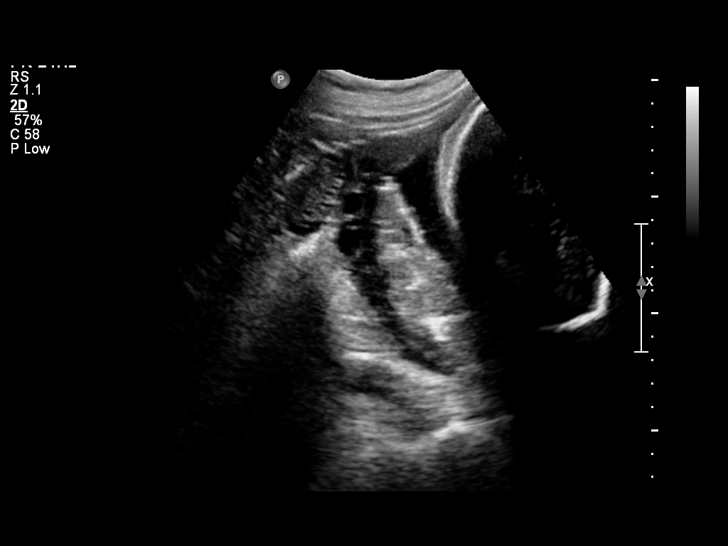
[im 45/51]
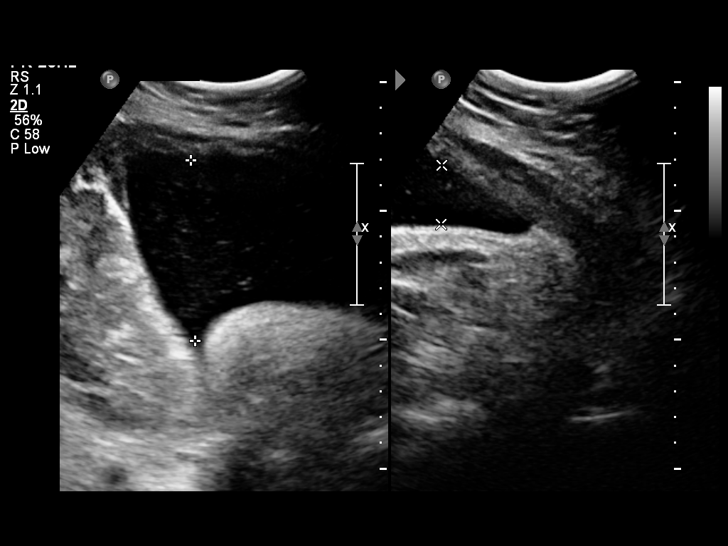
[im 49/51]
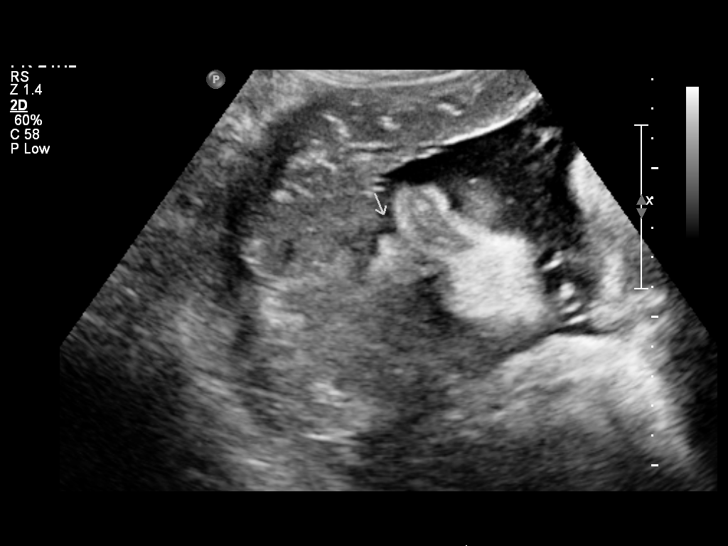

[12 of 28 positions shown; findings below may reference images not displayed]

OBSTETRICS REPORT
                      (Signed Final 03/13/2014 [DATE])

Service(s) Provided

 US OB FOLLOW UP                                       76816.1
Indications

 Gestational diabetes in pregnancy (glyburide)
 Advanced maternal age multigravida (37), third
 trimester
 Polyhydramnios
 38 weeks gestation of pregnancy
Fetal Evaluation

 Num Of Fetuses:    1
 Fetal Heart Rate:  155                          bpm
 Cardiac Activity:  Observed
 Presentation:      Breech
 Placenta:          Posterior Fundal, above
                    cervical os
 P. Cord            Previously Visualized
 Insertion:

 Amniotic Fluid
 AFI FV:      Subjectively within normal limits
 AFI Sum:     17.62   cm       71  %Tile     Larg Pckt:    6.98  cm
 RUQ:   3.66    cm   RLQ:    4.7    cm    LUQ:   6.98    cm   LLQ:    2.28   cm
Biometry

 BPD:     88.9  mm     G. Age:  36w 0d                CI:         75.5   70 - 86
 OFD:    117.7  mm                                    FL/HC:      22.3   20.6 -

 HC:     334.1  mm     G. Age:  38w 1d       26  %    HC/AC:      0.89   0.87 -

 AC:     377.3  mm     G. Age:  41w 4d     > 97  %    FL/BPD:     83.9   71 - 87
 FL:      74.6  mm     G. Age:  38w 1d       45  %    FL/AC:      19.8   20 - 24
 HUM:       65  mm     G. Age:  37w 5d       60  %

 Est. FW:    9777  gm      8 lb 9 oz   > 90  %
Gestational Age

 U/S Today:     38w 3d                                        EDD:   03/24/14
 Best:          38w 4d     Det. By:  Early Ultrasound         EDD:   03/23/14
                                     (09/07/13)
Anatomy

 Cranium:          Appears normal         Aortic Arch:      Previously seen
 Fetal Cavum:      Previously seen        Ductal Arch:      Previously seen
 Ventricles:       Appears normal         Diaphragm:        Appears normal
 Choroid Plexus:   Previously seen        Stomach:          Appears normal, left
                                                            sided
 Cerebellum:       Previously seen        Abdomen:          Previously seen
 Posterior Fossa:  Previously seen        Abdominal Wall:   Previously seen
 Nuchal Fold:      Not applicable (>20    Cord Vessels:     Previously seen
                   wks GA)
 Face:             Orbits and profile     Kidneys:          Appear normal
                   previously seen
 Lips:             Previously seen        Bladder:          Appears normal
 Heart:            Previously seen        Spine:            Previously seen
 RVOT:             Previously seen        Lower             Previously seen
                                          Extremities:
 LVOT:             Previously seen        Upper             Previously seen
                                          Extremities:

 Other:  Technically difficult due to advanced GA and fetal position. Heels and
         left 5th digit previously visualized. Female gender.
Cervix Uterus Adnexa

 Cervix:       Not visualized (advanced GA >62wks)
 Uterus:       No abnormality visualized.
 Cul De Sac:   No free fluid seen.

 Left Ovary:    No adnexal mass visualized.
 Right Ovary:   No adnexal mass visualized.
 Adnexa:     No adnexal mass visualized.
Impression

 Single IUP at 38w 4d
 Gestational diabetes on glyburide
 The estimated fetal weight today is > 90th %tile (9777 g).
 The AC is > 97th %tile.
 BREECH PRESENTATION
 Normal amniotic fluid volume
Recommendations

 Would counsel regarding ECV vs. primary C-section due to
 breech presentation
 At some risk for LGA infant - if labors, would follow labor
 curve closely and avoid operative vaginal delivery if possible.
